# Patient Record
Sex: Male | Born: 2000 | Race: Black or African American | Hispanic: No | Marital: Single | Smoking: Current every day smoker
Health system: Southern US, Community
[De-identification: ages and names within clinical notes are randomized; demographics above are authoritative.]

## PROBLEM LIST (undated history)

## (undated) ENCOUNTER — Encounter

## (undated) ENCOUNTER — Ambulatory Visit

## (undated) ENCOUNTER — Encounter: Attending: Infectious Disease | Primary: Infectious Disease

## (undated) ENCOUNTER — Telehealth: Attending: Family | Primary: Family

## (undated) ENCOUNTER — Encounter: Attending: Family | Primary: Family

## (undated) ENCOUNTER — Telehealth

## (undated) ENCOUNTER — Ambulatory Visit: Payer: PRIVATE HEALTH INSURANCE | Attending: Family | Primary: Family

## (undated) ENCOUNTER — Encounter: Payer: MEDICARE | Attending: Family | Primary: Family

## (undated) ENCOUNTER — Ambulatory Visit: Attending: Family | Primary: Family

## (undated) ENCOUNTER — Ambulatory Visit: Attending: Pharmacist | Primary: Pharmacist

---

## 2020-02-29 DIAGNOSIS — B20 Human immunodeficiency virus [HIV] disease: Principal | ICD-10-CM

## 2020-03-05 ENCOUNTER — Encounter: Admit: 2020-03-05 | Discharge: 2020-03-06 | Payer: MEDICARE | Attending: Family | Primary: Family

## 2020-03-05 ENCOUNTER — Encounter: Admit: 2020-03-05 | Discharge: 2020-03-06 | Payer: MEDICARE

## 2020-03-05 DIAGNOSIS — Z717 Human immunodeficiency virus [HIV] counseling: Principal | ICD-10-CM

## 2020-03-05 DIAGNOSIS — Z21 Asymptomatic human immunodeficiency virus [HIV] infection status: Principal | ICD-10-CM

## 2020-03-05 DIAGNOSIS — B20 Human immunodeficiency virus [HIV] disease: Principal | ICD-10-CM

## 2020-03-05 DIAGNOSIS — A64 Unspecified sexually transmitted disease: Principal | ICD-10-CM

## 2020-03-05 DIAGNOSIS — Z7252 High risk homosexual behavior: Principal | ICD-10-CM

## 2020-03-05 MED ORDER — BICTEGRAVIR 50 MG-EMTRICITABINE 200 MG-TENOFOVIR ALAFENAM 25 MG TABLET: 1 | tablet | Freq: Every day | 0 refills | 7 days

## 2020-03-05 MED ORDER — BICTEGRAVIR 50 MG-EMTRICITABINE 200 MG-TENOFOVIR ALAFENAM 25 MG TABLET
ORAL_TABLET | Freq: Every day | ORAL | 11 refills | 30.00000 days | Status: CP
Start: 2020-03-05 — End: 2021-03-05

## 2020-04-07 ENCOUNTER — Encounter: Admit: 2020-04-07 | Discharge: 2020-04-08 | Payer: MEDICARE

## 2020-04-07 ENCOUNTER — Encounter: Admit: 2020-04-07 | Discharge: 2020-04-08 | Payer: MEDICARE | Attending: Family | Primary: Family

## 2020-04-07 DIAGNOSIS — A64 Unspecified sexually transmitted disease: Principal | ICD-10-CM

## 2020-04-07 DIAGNOSIS — Z7189 Other specified counseling: Principal | ICD-10-CM

## 2020-04-07 DIAGNOSIS — Z717 Human immunodeficiency virus [HIV] counseling: Principal | ICD-10-CM

## 2020-04-07 DIAGNOSIS — Z7252 High risk homosexual behavior: Principal | ICD-10-CM

## 2020-04-07 DIAGNOSIS — Z21 Asymptomatic human immunodeficiency virus [HIV] infection status: Principal | ICD-10-CM

## 2020-04-07 DIAGNOSIS — B20 Human immunodeficiency virus [HIV] disease: Principal | ICD-10-CM

## 2020-07-08 ENCOUNTER — Encounter: Admit: 2020-07-08 | Payer: MEDICARE | Attending: Family | Primary: Family

## 2020-07-25 ENCOUNTER — Ambulatory Visit: Admit: 2020-07-25 | Payer: MEDICARE | Attending: Family | Primary: Family

## 2020-10-16 ENCOUNTER — Ambulatory Visit: Admit: 2020-10-16 | Discharge: 2020-10-17

## 2020-10-16 ENCOUNTER — Encounter: Admit: 2020-10-16 | Discharge: 2020-10-17 | Payer: MEDICARE | Attending: Family | Primary: Family

## 2020-10-16 DIAGNOSIS — Z717 Human immunodeficiency virus [HIV] counseling: Principal | ICD-10-CM

## 2020-10-16 DIAGNOSIS — Z21 Asymptomatic human immunodeficiency virus [HIV] infection status: Principal | ICD-10-CM

## 2020-10-16 DIAGNOSIS — Z7252 High risk homosexual behavior: Principal | ICD-10-CM

## 2020-10-16 DIAGNOSIS — R5383 Other fatigue: Principal | ICD-10-CM

## 2020-10-16 DIAGNOSIS — B20 Human immunodeficiency virus [HIV] disease: Principal | ICD-10-CM

## 2020-10-16 DIAGNOSIS — R590 Localized enlarged lymph nodes: Principal | ICD-10-CM

## 2020-10-16 DIAGNOSIS — Z9114 Patient's other noncompliance with medication regimen: Principal | ICD-10-CM

## 2020-10-17 ENCOUNTER — Ambulatory Visit: Admit: 2020-10-17 | Discharge: 2020-10-18 | Attending: Family | Primary: Family

## 2020-10-17 DIAGNOSIS — B20 Human immunodeficiency virus [HIV] disease: Principal | ICD-10-CM

## 2020-10-17 DIAGNOSIS — Z21 Asymptomatic human immunodeficiency virus [HIV] infection status: Principal | ICD-10-CM

## 2020-10-17 DIAGNOSIS — Z717 Human immunodeficiency virus [HIV] counseling: Principal | ICD-10-CM

## 2020-10-17 MED ORDER — BICTEGRAVIR 50 MG-EMTRICITABINE 200 MG-TENOFOVIR ALAFENAM 25 MG TABLET
ORAL_TABLET | Freq: Every day | ORAL | 0 refills | 14.00000 days
Start: 2020-10-17 — End: 2020-11-10

## 2020-10-21 DIAGNOSIS — B20 Human immunodeficiency virus [HIV] disease: Principal | ICD-10-CM

## 2020-10-21 DIAGNOSIS — Z21 Asymptomatic human immunodeficiency virus [HIV] infection status: Principal | ICD-10-CM

## 2020-10-21 DIAGNOSIS — Z717 Human immunodeficiency virus [HIV] counseling: Principal | ICD-10-CM

## 2020-10-21 MED ORDER — BICTEGRAVIR 50 MG-EMTRICITABINE 200 MG-TENOFOVIR ALAFENAM 25 MG TABLET
ORAL_TABLET | Freq: Every day | ORAL | 11 refills | 30.00000 days | Status: CP
Start: 2020-10-21 — End: 2020-10-28

## 2020-10-22 DIAGNOSIS — Z717 Human immunodeficiency virus [HIV] counseling: Principal | ICD-10-CM

## 2020-10-22 DIAGNOSIS — B20 Human immunodeficiency virus [HIV] disease: Principal | ICD-10-CM

## 2020-10-22 DIAGNOSIS — Z21 Asymptomatic human immunodeficiency virus [HIV] infection status: Principal | ICD-10-CM

## 2020-10-28 DIAGNOSIS — B20 Human immunodeficiency virus [HIV] disease: Principal | ICD-10-CM

## 2020-10-28 DIAGNOSIS — Z21 Asymptomatic human immunodeficiency virus [HIV] infection status: Principal | ICD-10-CM

## 2020-10-28 DIAGNOSIS — Z717 Human immunodeficiency virus [HIV] counseling: Principal | ICD-10-CM

## 2020-10-28 MED ORDER — BICTEGRAVIR 50 MG-EMTRICITABINE 200 MG-TENOFOVIR ALAFENAM 25 MG TABLET
ORAL_TABLET | Freq: Every day | ORAL | 11 refills | 30 days | Status: CP
Start: 2020-10-28 — End: 2021-10-28
  Filled 2020-10-30: qty 30, 30d supply, fill #0

## 2020-10-30 MED FILL — BIKTARVY 50 MG-200 MG-25 MG TABLET: 30 days supply | Qty: 30 | Fill #0 | Status: AC

## 2020-10-30 NOTE — Unmapped (Signed)
[facility]   Patient Onboarding/Medication Counseling    Alec Hines is a 19 y.o. male with HIV who I am counseling today on continuation of therapy.  I am speaking to the patient.    Was a Nurse, learning disability used for this call? No    Verified patient's date of birth / HIPAA.    Specialty medication(s) to be sent: Infectious Disease: Biktarvy      Non-specialty medications/supplies to be sent: n/a      Medications not needed at this time: n/a         Biktarvy (bictegravir, emtricitabine, and tenofovir alafenamide)    The patient declined counseling on medication administration, missed dose instructions, goals of therapy, side effects and monitoring parameters, warnings and precautions, drug/food interactions and storage, handling precautions, and disposal because they have taken the medication previously. The information in the declined sections below are for informational purposes only and was not discussed with patient.       Medication & Administration     Dosage: Take 1 tablet by mouth daily    Administration: Take without regard to food    Adherence/Missed dose instructions: take missed dose as soon as you remember. If it is close to the time of your next dose, skip the dose and resume with your next scheduled dose.    Goals of Therapy     To suppress viral replication and keep patient's HIV undetectable by lab tests    Side Effects & Monitoring Parameters     Common Side Effects:  ??? Diarrhea  ??? Upset stomach  ??? Headache  ??? Changes in Weight  ??? Changes in mood    The following side effects should be reported to the provider:     ?? If patient experiences: signs of an allergic reaction (rash; hives; itching; red, swollen, blistered, or peeling skin with or without fever; wheezing; tightness in the chest or throat; trouble breathing, swallowing, or talking; unusual hoarseness; or swelling of the mouth, face, lips, tongue, or throat)  ?? signs of kidney problems (unable to pass urine, change in how much urine is passed, blood in the urine, or a big weight gain)  ?? signs of liver problems (dark urine, feeling tired, not hungry, upset stomach or stomach pain, light-colored stools, throwing up, or yellow skin or eyes)  ?? signs of lactic acidosis (fast breathing, fast heartbeat, a heartbeat that does not feel normal, very bad upset stomach or throwing up, feeling very sleepy, shortness of breath, feeling very tired or weak, very bad dizziness, feeling cold, or muscle pain or cramps)  Weight gain: some patients have reported weight gain after starting this medication. The amount of weight can vary.    Monitoring Parameters:  ?? CD4  Count  ?? HIV RNA plasma levels,  ?? Liver function  ?? Total bilirubin  ?? serum creatinine  ?? urine glucose  ?? urine protein (prior to or when initiating therapy and as clinically indicated during therapy);       Drug/Food Interactions     ??? Medication list reviewed in Epic. The patient was instructed to inform the care team before taking any new medications or supplements. No drug interactions identified.   ??? Calcium Salts: May decrease the serum concentration of Biktarvy. If taken with food, Biktarvy can be administered with calcium salts.   ??? Iron Preparations: May decrease the serum concentration of Biktarvy. If taken with food, Biktarvy can be administered with Ferrous sulfate. If taken on an empty  stomach, Biktarvy must be taken 2 hours before ferrous sulfate. Avoid other iron salts.    Contraindications, Warnings, & Precautions     ??? Black Box Warning: Severe acute exacertbations of HBV have been reported in patients coinfected with HIV-1 and HBV fllowing discontinuation of therapy  ??? Coadministration with dofetilide, rifampin is contraindicated  ??? Immune reconstitution syndrome: Patients may develop immune reconstitution syndrome, resulting in the occurrence of an inflammatory response to an indolent or residual opportunistic infection or activation of autoimmune disorders (eg, Graves disease, polymyositis, Guillain-Barr?? syndrome, autoimmune hepatitis)   ??? Lactic acidosis/hepatomegaly  ??? Renal toxicity: patients with preexisting renal impairment and those taking nephrotoxic agents (including NSAIDs) are at increased risk.     Storage, Handling Precautions, & Disposal     ??? Store in the original container at room temperature.   ??? Keep lid tightly closed.   ??? Store in a dry place. Do not store in a bathroom.   ??? Keep all drugs in a safe place. Keep all drugs out of the reach of children and pets.   ??? Throw away unused or expired drugs. Do not flush down a toilet or pour down a drain unless you are told to do so. Check with your pharmacist if you have questions about the best way to throw out drugs. There may be drug take-back programs in your area.        Current Medications (including OTC/herbals), Comorbidities and Allergies     Current Outpatient Medications   Medication Sig Dispense Refill   ??? bictegrav-emtricit-tenofov ala (BIKTARVY) 50-200-25 mg tablet Take 1 tablet by mouth daily for 14 days. 14 tablet 0   ??? bictegrav-emtricit-tenofov ala (BIKTARVY) 50-200-25 mg tablet Take 1 tablet by mouth daily. 30 tablet 11     No current facility-administered medications for this visit.       No Known Allergies    There is no problem list on file for this patient.      Reviewed and up to date in Epic.    Appropriateness of Therapy     Is medication and dose appropriate based on diagnosis? Yes    Prescription has been clinically reviewed: Yes    Baseline Quality of Life Assessment      How many days over the past month did your HIV  keep you from your normal activities? For example, brushing your teeth or getting up in the morning. 0    Financial Information     Medication Assistance provided: None Required    Anticipated copay of $0.00 reviewed with patient. Verified delivery address.    Delivery Information     Scheduled delivery date: 10/30/20    Expected start date: 10/30/20    Medication will be delivered via Same Day Courier to the prescription address in [facility].  This shipment will not require a signature.      Explained the services we provide at [facility] and that each month we would call to set up refills.  Stressed importance of returning phone calls so that we could ensure they receive their medications in time each month.  Informed patient that we should be setting up refills 7-10 days prior to when they will run out of medication.  A pharmacist will reach out to perform a clinical assessment periodically.  Informed patient that a welcome packet and a drug information handout will be sent.      Patient verbalized understanding of the above information as well as  how to contact the pharmacy at [phone] option 4 with any questions/concerns.  The pharmacy is open Monday through Friday 8:30am-4:30pm.  A pharmacist is available 24/7 via pager to answer any clinical questions they may have.    Patient Specific Needs     - Does the patient have any physical, cognitive, or cultural barriers? No    - Patient prefers to have medications discussed with  Patient     - Is the patient or caregiver able to read and understand education materials at a high school level or above? Yes    - Patient's primary language is  English     - Is the patient high risk? No    - Does the patient require a Care Management Plan? No     - Does the patient require physician intervention or other additional services (i.e. nutrition, smoking cessation, social work)? No      [name]  [facility] Specialty Pharmacist

## 2020-10-30 NOTE — Unmapped (Signed)
[facility] Specialty Medication Onboarding    Specialty Medication: BIKTARVY  Prior Authorization: Not Required   Financial Assistance: No - copay  <$25  Final Copay/Day Supply: $0 / 30    Insurance Restrictions: Yes - max 1 month supply     Notes to Pharmacist: Pt has Temp PAP benefits    The triage team has completed the benefits investigation and has determined that the patient is able to fill this medication at [facility]. Please contact the patient to complete the onboarding or follow up with the prescribing physician as needed.

## 2020-11-07 ENCOUNTER — Ambulatory Visit: Admit: 2020-11-07 | Discharge: 2020-11-08 | Attending: Family | Primary: Family

## 2020-11-07 DIAGNOSIS — Z7252 High risk homosexual behavior: Principal | ICD-10-CM

## 2020-11-07 DIAGNOSIS — Z717 Human immunodeficiency virus [HIV] counseling: Principal | ICD-10-CM

## 2020-11-07 DIAGNOSIS — L709 Acne, unspecified: Principal | ICD-10-CM

## 2020-11-07 DIAGNOSIS — B20 Human immunodeficiency virus [HIV] disease: Principal | ICD-10-CM

## 2020-11-07 DIAGNOSIS — L739 Follicular disorder, unspecified: Principal | ICD-10-CM

## 2020-11-07 DIAGNOSIS — R21 Rash and other nonspecific skin eruption: Principal | ICD-10-CM

## 2020-11-07 DIAGNOSIS — Z21 Asymptomatic human immunodeficiency virus [HIV] infection status: Principal | ICD-10-CM

## 2020-11-07 MED ORDER — CHLORHEXIDINE GLUCONATE 4 % TOPICAL LIQUID
TOPICAL | 0 refills | 0.00000 days | Status: CP
Start: 2020-11-07 — End: ?
  Filled 2020-11-11: qty 118, 30d supply, fill #0

## 2020-11-07 MED ORDER — MUPIROCIN 2 % TOPICAL OINTMENT
Freq: Three times a day (TID) | TOPICAL | 0 refills | 7.00000 days | Status: CP
Start: 2020-11-07 — End: 2020-12-11
  Filled 2020-11-11: qty 22, 7d supply, fill #0

## 2020-11-07 MED ORDER — CLINDAMYCIN 1 % LOTION
Freq: Two times a day (BID) | TOPICAL | 0 refills | 0 days | Status: CP
Start: 2020-11-07 — End: 2021-11-07
  Filled 2020-11-13: qty 60, 15d supply, fill #0

## 2020-11-10 NOTE — Unmapped (Signed)
Assessment/Plan:      Diagnosis ICD-10-CM Associated Orders   1. Human immunodeficiency virus (HIV) disease (CMS-HCC)  B20 clindamycin (CLEOCIN T) 1 % lotion     chlorhexidine (HIBICLENS) 4 % external liquid     mupirocin (BACTROBAN) 2 % ointment   2. HIV antibody positive (CMS-HCC)  Z21 clindamycin (CLEOCIN T) 1 % lotion     chlorhexidine (HIBICLENS) 4 % external liquid     mupirocin (BACTROBAN) 2 % ointment   3. Encounter for HIV counseling  Z71.7 clindamycin (CLEOCIN T) 1 % lotion     chlorhexidine (HIBICLENS) 4 % external liquid     mupirocin (BACTROBAN) 2 % ointment   4. High risk homosexual behavior  Z72.52 clindamycin (CLEOCIN T) 1 % lotion     chlorhexidine (HIBICLENS) 4 % external liquid     mupirocin (BACTROBAN) 2 % ointment   5. Rash and nonspecific skin eruption  R21 clindamycin (CLEOCIN T) 1 % lotion     chlorhexidine (HIBICLENS) 4 % external liquid     mupirocin (BACTROBAN) 2 % ointment   6. Acne, unspecified acne type  L70.9 clindamycin (CLEOCIN T) 1 % lotion     chlorhexidine (HIBICLENS) 4 % external liquid     mupirocin (BACTROBAN) 2 % ointment   7. Folliculitis  L73.9 clindamycin (CLEOCIN T) 1 % lotion     chlorhexidine (HIBICLENS) 4 % external liquid     mupirocin (BACTROBAN) 2 % ointment       Return in about 1 month (around 12/08/2020).    HPI:     Alec Hines is a 20 y.o. year old Black or [place] American male who presents today for HIV follow-up.  Patient recently tested positive for HIV on 02/22/20. ??He was taking Truvada PrEP and went for follow up testing and HIV antibody returned positive. ??Patient has notified sexual contacts. ??Has been reported by [facility]. Patient was infected through homosexual contact about 9 months ago. Discussed HIV biology, medication compliance, resistance, side effects, safe sex with condoms, follow up appointments. Recently tested positive for chlamydia, treated. ??Patient chose to start HAART Biktarvy. ??HIV book, condoms, 7 day Biktarvy samples at last visit.  Last HIV RNA 32,305 CD4% 25 Absolute CD4 600. ??Has not been taking medication, there was an issue with his insurance.  He does have cervical lymphadenopathy, rash on both arms and trunk, follicullitis and fatigue secondary to HIV.  Will start him on topical remedies for acne/rash clobetisol for facial acne, hibiclens and bactroban for groin buttock abscesses and arm/torso rash.  Will collect labs today.  Follow up in??1??month.??      Past Medical/Surgical History:     Past Medical History:   Diagnosis Date   ??? Chlamydia    ??? HIV disease (CMS-HCC)      No past surgical history on file.    Social History:     Social History     Socioeconomic History   ??? Marital status: None     Spouse name: None   ??? Number of children: None   ??? Years of education: None   ??? Highest education level: None   Occupational History   ??? None   Tobacco Use   ??? Smoking status: Current Every Day Smoker     Types: e-Cigarettes   ??? Smokeless tobacco: Never Used   Vaping Use   ??? Vaping Use: Every day   ??? Substances: Nicotine, Flavoring   Substance and Sexual Activity   ??? Alcohol use: Yes  Comment: every now and then   ??? Drug use: Yes     Types: Marijuana   ??? Sexual activity: None   Other Topics Concern   ??? None   Social History Narrative   ??? None     Social Determinants of Health     Financial Resource Strain: Not on file   Food Insecurity: Not on file   Transportation Needs: Not on file   Physical Activity: Not on file   Stress: Not on file   Social Connections: Not on file       Family History:     Family History   Problem Relation Age of Onset   ??? No Known Problems Mother    ??? No Known Problems Father    ??? No Known Problems Brother        Allergies:     Patient has no known allergies.    Current Medications:     Current Outpatient Medications   Medication Sig Dispense Refill   ??? bictegrav-emtricit-tenofov ala (BIKTARVY) 50-200-25 mg tablet Take 1 tablet by mouth daily. 30 tablet 11   ??? chlorhexidine (HIBICLENS) 4 % external liquid Apply topically once a week. To affected areas.  Allow to dry 1 hour then shower off 120 mL 0   ??? clindamycin (CLEOCIN T) 1 % lotion Apply topically Two (2) times a day. 60 mL 0   ??? mupirocin (BACTROBAN) 2 % ointment Apply topically Three (3) times a day for 7 days. 30 g 0     No current facility-administered medications for this visit.       ROS:     Constitutional: Negative for fever, chills, appetite change, fatigue and unexpected weight change.   HEENT:  Head: Negative for headache, head injury, dizziness, lightheadedness.  Eyes: Glasses or contact lenses.  Negative for pain, redness, excessive tearing, double or blurred vision, spots, specks, flashing lights, glaucoma, cataracts, photophobia.  Ears:  Negative for hearing change, tinnitus, vertigo, earache, infection, discharge.  Nose and sinuses: Negative for frequent colds, nasal stuffiness, discharge or itching, hay fever, nosebleeds.  Throat: Negative for dental abscess, bleeding gums, dentures, sore tongue, dry mouth, frequent sore throats, hoarseness.    Neck:  Negative for lumps, swollen glands, goiter, pain, stiffness  Respiratory: Negative for cough, sputum, hemoptysis, dyspnea, wheezing, pleurisy, last chest x-ray.  Negative for asthma, bronchitis, emphysema, pneumonia, tuberculosis.??   Cardiovascular: Negative for chest pain, palpitations, hypertension, rheumatic fever, heart murmurs, dyspnea, orthopnea, paroxysmal nocturnal dyspnea, edema, and leg swelling.   Gastrointestinal: Negative for dysphagia, nausea, vomiting, abdominal pain, diarrhea, constipation, blood in stool, abdominal distention, heartburn, appetite change, food intolerance, and anal bleeding.   Endocrine: Negative for cold intolerance, heat intolerance, polydipsia, polyphagia and polyuria.   Genitourinary: Negative for dysuria, urgency, frequency, flank pain, difficulty urinating, hematuria, hesitancy, dribbling, difficulty starting/stopping stream, and genital sores. Musculoskeletal: Negative for myalgias, back pain, joint swelling, arthralgias, gait problem and neck stiffness. ??  Skin: Negative for color change, pallor, rash, itching, and wound.   Allergic/Immunologic: Negative for environmental allergies and food allergies.   Neurological: Negative for dizziness, tremors, seizures, syncope, speech difficulty, weakness, light-headedness, numbness and headaches.  Negative for changes in mood, attention, orientation, or speech.   Hematological: Negative for adenopathy. Does not bruise/bleed easily.  Negative transfusion reactions.   Psychiatric/Behavioral: Negative for suicidal ideas, hallucinations, behavioral problems, confusion, sleep disturbance, self-injury, dyphoric mood and agitation. The patient is not nervous/anxious.??     Vital Signs:     Vitals:  11/07/20 1139   BP: 101/67   Pulse: 80   Resp: 16   Temp: 36.7 ??C (98 ??F)   SpO2: 98%     BP Readings from Last 3 Encounters:   11/07/20 101/67   10/16/20 101/64   04/07/20 111/73     Wt Readings from Last 3 Encounters:   11/07/20 67.1 kg (148 lb) (38 %, Z= -0.31)*   10/16/20 69.4 kg (153 lb) (47 %, Z= -0.09)*   04/07/20 70.3 kg (155 lb) (53 %, Z= 0.06)*     * Growth percentiles are based on CDC (Boys, 2-20 Years) data.       Immunizations:       There is no immunization history on file for this patient.    Labs:     Platelet   Date Value Ref Range Status   10/16/2020 313 150 - 450 10*9/L Final     HGB   Date Value Ref Range Status   10/16/2020 12.5 (L) 13.5 - 17.5 g/dL Final     WBC   Date Value Ref Range Status   10/16/2020 5.3 3.5 - 10.5 10*9/L Final     BUN   Date Value Ref Range Status   10/16/2020 9 9 - 23 mg/dL Final     Creatinine   Date Value Ref Range Status   10/16/2020 0.86 0.70 - 1.30 mg/dL Final     ALT   Date Value Ref Range Status   10/16/2020 29 10 - 49 U/L Final     AST   Date Value Ref Range Status   10/16/2020 29 <34 U/L Final     Potassium   Date Value Ref Range Status   10/16/2020 4.0 3.5 - 5.1 mmol/L Final     CO2   Date Value Ref Range Status   10/16/2020 27.0 20.0 - 31.0 mmol/L Final     HIV RNA   Date Value Ref Range Status   10/16/2020 32,305 (H) <0 copies/mL Final     Hepatitis C Ab   Date Value Ref Range Status   03/05/2020 Nonreactive Nonreactive Final     Gonorrhoeae NAA   Date Value Ref Range Status   03/05/2020 Negative Negative Final     Chlamydia trachomatis, NAA   Date Value Ref Range Status   03/05/2020 Negative Negative Final     CD4% (T Helper)   Date Value Ref Range Status   10/16/2020 25 (L) 34 - 58 % Final         Physical Exam:     General: Well-appearing, calm, well-groomed, well-nourished.  No distress  Eyes: PERRL. Extraocular muscles intact, sclera clear   ENT: Nares without discharge, oropharynx without lesions or exudate.   Neck: Supple, no thyromegaly, bruit, or JVD.   Lymph Nodes: No adenopathy (cervical, axillary)   Cardiovascular: RRR, S1 and S2 normal. No murmur, rub, or gallop.   Lungs: Clear to auscultation bilaterally without wheezes/crackles/rhonchi. Good air movement.   Skin: No rashes or lesions noted on limited exam. Warm and dry with no erythema or pallor.  Abdomen: Normoactive bowel sounds, abdomen soft, nontender, and nondistended, no hepatosplenomegaly or masses. Liver normal in size, no rebound or guarding.   Genitourinary: Deferred   Extremities: No cyanosis, clubbing or edema. No joint effusions, FROM without tenderness.   Musculoskeletal: No spine or costovertebral angle tenderness.   Neuro: Alert and oriented to person, place, and time. Cranial nerves II-XII grossly intact, normal gait, normal sensation throughout, normal cerebellar function.  Psychiatry: Alert and oriented to person, place, and time, conversant, appropriate affect.   Nursing notes and vitals reviewed

## 2020-11-17 ENCOUNTER — Emergency Department (HOSPITAL_COMMUNITY)
Admission: EM | Admit: 2020-11-17 | Discharge: 2020-11-17 | Disposition: A | Discharge status: Home / Self Care | Payer: Self-pay | Attending: Emergency Medicine | Admitting: Emergency Medicine

## 2020-11-17 ENCOUNTER — Encounter (HOSPITAL_COMMUNITY): Payer: Self-pay | Admitting: Emergency Medicine

## 2020-11-17 ENCOUNTER — Other Ambulatory Visit: Payer: Self-pay

## 2020-11-17 DIAGNOSIS — Z5321 Procedure and treatment not carried out due to patient leaving prior to being seen by health care provider: Secondary | ICD-10-CM | POA: Insufficient documentation

## 2020-11-17 DIAGNOSIS — H579 Unspecified disorder of eye and adnexa: Secondary | ICD-10-CM | POA: Insufficient documentation

## 2020-11-17 DIAGNOSIS — H02849 Edema of unspecified eye, unspecified eyelid: Secondary | ICD-10-CM | POA: Insufficient documentation

## 2020-11-17 DIAGNOSIS — H538 Other visual disturbances: Secondary | ICD-10-CM | POA: Insufficient documentation

## 2020-11-17 DIAGNOSIS — H5789 Other specified disorders of eye and adnexa: Secondary | ICD-10-CM | POA: Insufficient documentation

## 2020-11-17 NOTE — ED Triage Notes (Signed)
Per pt, states he was diagnosed with pink eye this past Friday-states he was given antibiotic cream-states he is still having redness and pain, not getting better

## 2020-11-17 NOTE — ED Triage Notes (Signed)
Patient developed left eye redness and blurry vision [facility] given eye ointment antibiotic. Here for evaluation because having same symptoms.

## 2020-11-18 MED ORDER — TOBRAMYCIN 0.3 %-DEXAMETHASONE 0.1 % EYE DROPS,SUSPENSION
0 days
Start: 2020-11-18 — End: ?

## 2020-11-20 NOTE — Unmapped (Addendum)
[facility] Refill Coordination Note    Specialty Medication(s) to be Shipped:   Infectious Disease: Biktarvy    Other medication(s) to be shipped: No additional medications requested for fill at this time     Alec Hines, DOB: December 07, 2000  Phone: [phone] (home)       All above HIPAA information was verified with patient.     Was a Nurse, learning disability used for this call? No    Completed refill call assessment today to schedule patient's medication shipment from the [facility] [phone]).       Specialty medication(s) and dose(s) confirmed: Regimen is correct and unchanged.   Changes to medications: Alec Hines reports no changes at this time.  Changes to insurance: No  Questions for the pharmacist: No    Confirmed patient received Welcome Packet with first shipment. The patient will receive a drug information handout for each medication shipped and additional FDA Medication Guides as required.       DISEASE/MEDICATION-SPECIFIC INFORMATION        N/A    SPECIALTY MEDICATION ADHERENCE     Medication Adherence    Patient reported X missed doses in the last month: 0  Specialty Medication: biktarvy 50-2000-25mg   Patient is on additional specialty medications: No  Patient is on more than two specialty medications: No  Any gaps in refill history greater than 2 weeks in the last 3 months: no  Demonstrates understanding of importance of adherence: yes  Informant: patient  Reliability of informant: reliable  Provider-estimated medication adherence level: good  Patient is at risk for Non-Adherence: No                biktarvy 50-200-25 mg: 10 days of medicine on hand         SHIPPING     Shipping address confirmed in Epic.     Delivery Scheduled: Yes, Expected medication delivery date: 01/25.     Medication will be delivered via UPS to the temporary address in Epic WAM.    Alec Hines   [facility] Specialty Technician

## 2020-11-24 NOTE — Unmapped (Signed)
Alec Hines 's biktarvy shipment will be delayed due to pt already received a 30 days supply on Biktarvy last month,he is going to go tom to turn in application  We have contacted the patient and communicated the delivery change to patient/caregiver We will wait for a call back from the patient/caregiver to reschedule the delivery.  We have confirmed the delivery date as n/a .

## 2020-11-24 NOTE — Unmapped (Signed)
Spoke to patient's mother about HMAP application.

## 2020-11-24 NOTE — Unmapped (Signed)
Patients mom calls in regards to patient. Patients mother states patient has a lot going on and does not know exactly what's going on with patient and care. Patient mom has questions in regards to care and asks for a return call. Please advise

## 2020-11-25 NOTE — Unmapped (Signed)
Pt called today to ask about getting samples of his HIV medication. He has not yet returned his required paperwork to [facility] for Patient Assistance.   I have advised the pt that we do NOT have a provider in our office today due to an emergency - therefore we are unable to provide samples today. He reports he will try back on the 27th. He was also provided with the contact # for [facility] for [facility]. I have advised Alec Hines he may be able to fax the paperwork for faster service.   He voiced understanding and agreed with plan.

## 2020-11-26 NOTE — Unmapped (Signed)
Alec Hines 's Biktarvy shipment will be delayed due to pt had to get approved for PAP full benefit We have contacted the patient and communicated the delivery change to patient/caregiver We will reschedule the medication for the delivery date that the patient agreed upon. We have confirmed the delivery date as 01/28 .

## 2020-11-27 MED ORDER — PREDNISONE 10 MG TABLET
0 days
Start: 2020-11-27 — End: ?

## 2020-11-27 MED FILL — BIKTARVY 50 MG-200 MG-25 MG TABLET: ORAL | 30 days supply | Qty: 30 | Fill #1

## 2020-12-11 ENCOUNTER — Ambulatory Visit: Admit: 2020-12-11 | Discharge: 2020-12-12 | Payer: MEDICARE | Attending: Family | Primary: Family

## 2020-12-11 ENCOUNTER — Ambulatory Visit: Admit: 2020-12-11 | Discharge: 2020-12-12 | Payer: MEDICARE

## 2020-12-11 DIAGNOSIS — H539 Unspecified visual disturbance: Principal | ICD-10-CM

## 2020-12-11 DIAGNOSIS — Z21 Asymptomatic human immunodeficiency virus [HIV] infection status: Principal | ICD-10-CM

## 2020-12-11 DIAGNOSIS — R21 Rash and other nonspecific skin eruption: Principal | ICD-10-CM

## 2020-12-11 DIAGNOSIS — Z717 Human immunodeficiency virus [HIV] counseling: Principal | ICD-10-CM

## 2020-12-11 DIAGNOSIS — H5789 Other specified disorders of eye and adnexa: Principal | ICD-10-CM

## 2020-12-11 DIAGNOSIS — Z7252 High risk homosexual behavior: Principal | ICD-10-CM

## 2020-12-11 DIAGNOSIS — B20 Human immunodeficiency virus [HIV] disease: Principal | ICD-10-CM

## 2020-12-11 DIAGNOSIS — L709 Acne, unspecified: Principal | ICD-10-CM

## 2020-12-11 LAB — CBC W/ AUTO DIFF
BASOPHILS ABSOLUTE COUNT: 0 10*9/L (ref 0.0–0.1)
BASOPHILS RELATIVE PERCENT: 0.4 %
EOSINOPHILS ABSOLUTE COUNT: 0.1 10*9/L (ref 0.0–0.7)
EOSINOPHILS RELATIVE PERCENT: 0.7 %
HEMATOCRIT: 35.3 % — ABNORMAL LOW (ref 38.0–50.0)
HEMOGLOBIN: 11.8 g/dL — ABNORMAL LOW (ref 13.5–17.5)
LYMPHOCYTES ABSOLUTE COUNT: 2.1 10*9/L (ref 0.7–4.0)
LYMPHOCYTES RELATIVE PERCENT: 23.7 %
MEAN CORPUSCULAR HEMOGLOBIN CONC: 33.5 g/dL (ref 30.0–36.0)
MEAN CORPUSCULAR HEMOGLOBIN: 28.2 pg (ref 26.0–34.0)
MEAN CORPUSCULAR VOLUME: 84.2 fL (ref 81.0–95.0)
MEAN PLATELET VOLUME: 7.5 fL (ref 7.0–10.0)
MONOCYTES ABSOLUTE COUNT: 0.7 10*9/L (ref 0.1–1.0)
MONOCYTES RELATIVE PERCENT: 8 %
NEUTROPHILS ABSOLUTE COUNT: 5.9 10*9/L (ref 1.7–7.7)
NEUTROPHILS RELATIVE PERCENT: 67.2 %
PLATELET COUNT: 300 10*9/L (ref 150–450)
RED BLOOD CELL COUNT: 4.19 10*12/L — ABNORMAL LOW (ref 4.32–5.72)
RED CELL DISTRIBUTION WIDTH: 16.5 % — ABNORMAL HIGH (ref 12.0–15.0)
WBC ADJUSTED: 8.8 10*9/L (ref 3.5–10.5)

## 2020-12-11 LAB — COMPREHENSIVE METABOLIC PANEL
ALBUMIN: 3.2 g/dL — ABNORMAL LOW (ref 3.4–5.0)
ALKALINE PHOSPHATASE: 64 U/L (ref 46–116)
ALT (SGPT): 47 U/L (ref 10–49)
ANION GAP: 7 mmol/L (ref 3–11)
AST (SGOT): 28 U/L (ref <34)
BILIRUBIN TOTAL: 0.3 mg/dL (ref 0.3–1.2)
BLOOD UREA NITROGEN: 12 mg/dL (ref 9–23)
BUN / CREAT RATIO: 14
CALCIUM: 9.2 mg/dL (ref 8.7–10.4)
CHLORIDE: 106 mmol/L (ref 98–107)
CO2: 25 mmol/L (ref 20.0–31.0)
CREATININE: 0.84 mg/dL (ref 0.70–1.30)
EGFR CKD-EPI AA MALE: >90 mL/min/{1.73_m2}
EGFR CKD-EPI NON-AA MALE: >90 mL/min/{1.73_m2}
GLUCOSE RANDOM: 149 mg/dL (ref 70–179)
POTASSIUM: 3.5 mmol/L (ref 3.5–5.1)
PROTEIN TOTAL: 7.3 g/dL (ref 5.7–8.2)
SODIUM: 138 mmol/L (ref 136–145)

## 2020-12-11 NOTE — Unmapped (Signed)
Spoke to patient about getting additional labs Quantiferon and AFB blood cultures drawn.  He will go to [facility] lab in [place] later today.

## 2020-12-11 NOTE — Unmapped (Signed)
Assessment/Plan:      Diagnosis ICD-10-CM Associated Orders   1. Human immunodeficiency virus (HIV) disease (CMS-HCC)  B20 CBC w/ Differential     Metabolic panel, Comp (Chem10 + LFTs)     HIV RNA, Quantitative, PCR     Lymphocyte Markers Limited     CMV IgG     CMV DNA, quantitative, PCR     HSV PCR     Varicella zoster antibody, IgG     Syphilis Screen     Toxoplasma gondii Antibody, IgG     Quantiferon TB Gold Plus     Blood Culture, AFB Peripheral   2. HIV antibody positive (CMS-HCC)  Z21 CBC w/ Differential     Metabolic panel, Comp (Chem10 + LFTs)     HIV RNA, Quantitative, PCR     Lymphocyte Markers Limited     CMV IgG     CMV DNA, quantitative, PCR     HSV PCR     Varicella zoster antibody, IgG     Syphilis Screen     Toxoplasma gondii Antibody, IgG     Quantiferon TB Gold Plus     Blood Culture, AFB Peripheral   3. Encounter for HIV counseling  Z71.7 CBC w/ Differential     Metabolic panel, Comp (Chem10 + LFTs)     HIV RNA, Quantitative, PCR     Lymphocyte Markers Limited     CMV IgG     CMV DNA, quantitative, PCR     HSV PCR     Varicella zoster antibody, IgG     Syphilis Screen     Toxoplasma gondii Antibody, IgG     Quantiferon TB Gold Plus     Blood Culture, AFB Peripheral   4. High risk homosexual behavior  Z72.52 CBC w/ Differential     Metabolic panel, Comp (Chem10 + LFTs)     HIV RNA, Quantitative, PCR     Lymphocyte Markers Limited     CMV IgG     CMV DNA, quantitative, PCR     HSV PCR     Varicella zoster antibody, IgG     Syphilis Screen     Toxoplasma gondii Antibody, IgG     Quantiferon TB Gold Plus     Blood Culture, AFB Peripheral   5. Rash and nonspecific skin eruption  R21 CBC w/ Differential     Metabolic panel, Comp (Chem10 + LFTs)     HIV RNA, Quantitative, PCR     Lymphocyte Markers Limited     CMV IgG     CMV DNA, quantitative, PCR     HSV PCR     Varicella zoster antibody, IgG     Syphilis Screen     Toxoplasma gondii Antibody, IgG     Quantiferon TB Gold Plus     Blood Culture, AFB Peripheral   6. Acne, unspecified acne type  L70.9 CBC w/ Differential     Metabolic panel, Comp (Chem10 + LFTs)     HIV RNA, Quantitative, PCR     Lymphocyte Markers Limited     CMV IgG     CMV DNA, quantitative, PCR     HSV PCR     Varicella zoster antibody, IgG     Syphilis Screen     Toxoplasma gondii Antibody, IgG     Quantiferon TB Gold Plus     Blood Culture, AFB Peripheral   7. Eye inflammation  H57.89 CBC w/ Differential     Metabolic panel, Comp (  Chem10 + LFTs)     HIV RNA, Quantitative, PCR     Lymphocyte Markers Limited     CMV IgG     CMV DNA, quantitative, PCR     HSV PCR     Varicella zoster antibody, IgG     Syphilis Screen     Toxoplasma gondii Antibody, IgG     Quantiferon TB Gold Plus     Blood Culture, AFB Peripheral   8. Vision changes  H53.9 Toxoplasma gondii Antibody, IgG     Quantiferon TB Gold Plus     Blood Culture, AFB Peripheral       Return in about 3 months (around 20/08/2021).    HPI:     Alec Hines is a 20 y.o. year old Black or [place] American male who presents today for HIV follow-up.  Patient recently tested positive for HIV on 02/22/20. ??He was taking Truvada PrEP and went for follow up testing and HIV antibody returned positive. ??Patient has notified sexual contacts. ??Has been reported by [facility] [facility]. Patient was infected through homosexual contact about??9??months ago. Discussed HIV biology, medication compliance, resistance, side effects, safe sex with condoms, follow up appointments. Recently tested positive for chlamydia, treated. ??Patient chose to start HAART Biktarvy. ??HIV book, condoms, 7 day Biktarvy samples at last visit.  Last HIV RNA??32,305??CD4% 25??Absolute CD4 600.????Has been taking medication with no missed doses.  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 ??He does have rash on both arms and trunk that is improving.  Has some darkened skin on his nose.  He will see Dermatology for evaluation.  Started him on topical remedies for acne/rash clobetisol for facial acne, improving.  He has seen Optmetrist for left eye pain and inflammation.  He is using drops daily.  Will check additional labs for CMV, toxoplasmosis, quantiferon, AFB blood culture, HSV.  Will collect HIV labs today. ??Follow up in??3??months.??        Past Medical/Surgical History:     Past Medical History:   Diagnosis Date   ??? Chlamydia    ??? HIV disease (CMS-HCC)      No past surgical history on file.    Social History:     Social History     Socioeconomic History   ??? Marital status: None     Spouse name: None   ??? Number of children: None   ??? Years of education: None   ??? Highest education level: None   Occupational History   ??? None   Tobacco Use   ??? Smoking status: Former Smoker     Types: e-Cigarettes   ??? Smokeless tobacco: Never Used   Vaping Use   ??? Vaping Use: Former   ??? Quit date: 12/04/2020   ??? Substances: Nicotine, Flavoring   Substance and Sexual Activity   ??? Alcohol use: Yes     Comment: every now and then   ??? Drug use: Yes     Types: Marijuana   ??? Sexual activity: None   Other Topics Concern   ??? None   Social History Narrative   ??? None     Social Determinants of Health     Financial Resource Strain: Not on file   Food Insecurity: Not on file   Transportation Needs: Not on file   Physical Activity: Not on file   Stress: Not on file   Social Connections: Not on file       Family History:     Family History  Problem Relation Age of Onset   ??? No Known Problems Mother    ??? No Known Problems Father    ??? No Known Problems Brother        Allergies:     Patient has no known allergies.    Current Medications:     Current Outpatient Medications   Medication Sig Dispense Refill   ??? bictegrav-emtricit-tenofov ala (BIKTARVY) 50-200-25 mg tablet Take 1 tablet by mouth daily. 30 tablet 11   ??? chlorhexidine (HIBICLENS) 4 % external liquid Apply to affected areas once a week.  Allow to dry 1 hour then shower off. 118 mL 0   ??? clindamycin (CLEOCIN T) 1 % lotion Apply topically Two (2) times a day. 60 mL 0   ??? predniSONE (DELTASONE) 10 MG tablet TAKE 6 TABLETS BY MOUTH DAILY.     ??? tobramycin-dexamethasone (TOBRADEX) 0.3-0.1 % ophthalmic suspension        No current facility-administered medications for this visit.       ROS:     Constitutional: Negative for fever, chills, appetite change, fatigue and unexpected weight change.   HEENT:  Head: Negative for headache, head injury, dizziness, lightheadedness.  Eyes: Glasses or contact lenses.  Negative for pain, redness, excessive tearing, double or blurred vision, spots, specks, flashing lights, glaucoma, cataracts, photophobia.  Ears:  Negative for hearing change, tinnitus, vertigo, earache, infection, discharge.  Nose and sinuses: Negative for frequent colds, nasal stuffiness, discharge or itching, hay fever, nosebleeds.  Throat: Negative for dental abscess, bleeding gums, dentures, sore tongue, dry mouth, frequent sore throats, hoarseness.    Neck:  Negative for lumps, swollen glands, goiter, pain, stiffness  Respiratory: Negative for cough, sputum, hemoptysis, dyspnea, wheezing, pleurisy, last chest x-ray.  Negative for asthma, bronchitis, emphysema, pneumonia, tuberculosis.??   Cardiovascular: Negative for chest pain, palpitations, hypertension, rheumatic fever, heart murmurs, dyspnea, orthopnea, paroxysmal nocturnal dyspnea, edema, and leg swelling.   Gastrointestinal: Negative for dysphagia, nausea, vomiting, abdominal pain, diarrhea, constipation, blood in stool, abdominal distention, heartburn, appetite change, food intolerance, and anal bleeding.   Endocrine: Negative for cold intolerance, heat intolerance, polydipsia, polyphagia and polyuria.   Genitourinary: Negative for dysuria, urgency, frequency, flank pain, difficulty urinating, hematuria, hesitancy, dribbling, difficulty starting/stopping stream, and genital sores.   Musculoskeletal: Negative for myalgias, back pain, joint swelling, arthralgias, gait problem and neck stiffness. ??  Skin: Negative for color change, pallor, rash, itching, and wound.   Allergic/Immunologic: Negative for environmental allergies and food allergies.   Neurological: Negative for dizziness, tremors, seizures, syncope, speech difficulty, weakness, light-headedness, numbness and headaches.  Negative for changes in mood, attention, orientation, or speech.   Hematological: Negative for adenopathy. Does not bruise/bleed easily.  Negative transfusion reactions.   Psychiatric/Behavioral: Negative for suicidal ideas, hallucinations, behavioral problems, confusion, sleep disturbance, self-injury, dyphoric mood and agitation. The patient is not nervous/anxious.??     Vital Signs:     Vitals:    12/11/20 0908   BP: 102/65   Pulse: 109   Resp: 16   Temp: 36.7 ??C (98 ??F)   SpO2: 97%     BP Readings from Last 3 Encounters:   12/11/20 102/65   11/07/20 101/67   10/16/20 101/64     Wt Readings from Last 3 Encounters:   12/11/20 70.8 kg (156 lb) (51 %, Z= 0.02)*   11/07/20 67.1 kg (148 lb) (38 %, Z= -0.31)*   10/16/20 69.4 kg (153 lb) (47 %, Z= -0.09)*     * Growth percentiles are based  on CDC (Boys, 2-20 Years) data.       Immunizations:       There is no immunization history on file for this patient.    Labs:     Platelet   Date Value Ref Range Status   10/16/2020 313 150 - 450 10*9/L Final     HGB   Date Value Ref Range Status   10/16/2020 12.5 (L) 13.5 - 17.5 g/dL Final     WBC   Date Value Ref Range Status   10/16/2020 5.3 3.5 - 10.5 10*9/L Final     BUN   Date Value Ref Range Status   10/16/2020 9 9 - 23 mg/dL Final     Creatinine   Date Value Ref Range Status   10/16/2020 0.86 0.70 - 1.30 mg/dL Final     ALT   Date Value Ref Range Status   10/16/2020 29 10 - 49 U/L Final     AST   Date Value Ref Range Status   10/16/2020 29 <34 U/L Final     Potassium   Date Value Ref Range Status   10/16/2020 4.0 3.5 - 5.1 mmol/L Final     CO2   Date Value Ref Range Status   10/16/2020 27.0 20.0 - 31.0 mmol/L Final     HIV RNA   Date Value Ref Range Status   10/16/2020 32,305 (H) <0 copies/mL Final     Hepatitis C Ab   Date Value Ref Range Status   03/05/2020 Nonreactive Nonreactive Final     Gonorrhoeae NAA   Date Value Ref Range Status   03/05/2020 Negative Negative Final     Chlamydia trachomatis, NAA   Date Value Ref Range Status   03/05/2020 Negative Negative Final     CD4% (T Helper)   Date Value Ref Range Status   10/16/2020 25 (L) 34 - 58 % Final         Physical Exam:     General: Well-appearing, calm, well-groomed, well-nourished.  No distress  Eyes: PERRL. Extraocular muscles intact, sclera clear   ENT: Nares without discharge, oropharynx without lesions or exudate.   Neck: Supple, no thyromegaly, bruit, or JVD.   Lymph Nodes: No adenopathy (cervical, axillary)   Cardiovascular: RRR, S1 and S2 normal. No murmur, rub, or gallop.   Lungs: Clear to auscultation bilaterally without wheezes/crackles/rhonchi. Good air movement.   Skin: No rashes or lesions noted on limited exam. Warm and dry with no erythema or pallor.  Abdomen: Normoactive bowel sounds, abdomen soft, nontender, and nondistended, no hepatosplenomegaly or masses. Liver normal in size, no rebound or guarding.   Genitourinary: Deferred   Extremities: No cyanosis, clubbing or edema. No joint effusions, FROM without tenderness.   Musculoskeletal: No spine or costovertebral angle tenderness.   Neuro: Alert and oriented to person, place, and time. Cranial nerves II-XII grossly intact, normal gait, normal sensation throughout, normal cerebellar function.  Psychiatry: Alert and oriented to person, place, and time, conversant, appropriate affect.   Nursing notes and vitals reviewed

## 2020-12-12 LAB — CMV DNA, QUANTITATIVE, PCR
CMV QUANT: <50 [IU]/mL — ABNORMAL HIGH (ref <0)
CMV VIRAL LD: DETECTED — AB

## 2020-12-12 LAB — LYMPH MARKER LIMITED,FLOW
ABSOLUTE CD3 CNT: 1491 {cells}/uL (ref 915–3400)
ABSOLUTE CD4 CNT: 798 {cells}/uL (ref 510–2320)
ABSOLUTE CD8 CNT: 672 {cells}/uL (ref 180–1520)
CD3% (T CELLS)": 71 % (ref 61–86)
CD4% (T HELPER)": 38 % (ref 34–58)
CD4:CD8 RATIO: 1.2 (ref 0.9–4.8)
CD8% T SUPPRESR": 32 % (ref 12–38)

## 2020-12-12 LAB — CMV IGG: CMV IGG: POSITIVE — AB

## 2020-12-12 LAB — SYPHILIS SCREEN: SYPHILIS AB IGG/IGM SCREEN: POSITIVE — AB

## 2020-12-12 LAB — VARICELLA ZOSTER ANTIBODY, IGG: VARICELLA ZOSTER IGG: NEGATIVE — AB

## 2020-12-13 LAB — RPR, REVERSE ALGORITHM REFLEX: SYPHILIS RPR SCREEN: REACTIVE — AB

## 2020-12-16 DIAGNOSIS — H209 Unspecified iridocyclitis: Principal | ICD-10-CM

## 2020-12-16 LAB — HIV RNA, QUANTITATIVE, PCR: HIV RNA QNT RSLT: NOT DETECTED

## 2020-12-16 MED ORDER — PENICILLIN G IVPB 4 MILLION UNITS IN 100 ML
INTRAVENOUS | 0 refills | 14 days | Status: CP
Start: 2020-12-16 — End: 2020-12-30

## 2020-12-16 NOTE — Unmapped (Signed)
Spoke to patient regarding treatment for right eye uveitis with PenG 4 million units q4 hours for 14 days via PICC

## 2020-12-18 NOTE — Unmapped (Signed)
I have called and notified pt of   PICC placement: 12/19/2020 @ 1:00pm  First Infusion: 12/19/2020 @ 1:30pm   Home Infusion: [facility]  Home Health: TBD by [facility]     Pt voiced understanding and agreed with plan. He states he has already spoken with [facility] and they will be setting up for med delivery and HH to come out on Saturday 12/21/19.

## 2020-12-19 ENCOUNTER — Ambulatory Visit: Admit: 2020-12-19 | Discharge: 2020-12-19 | Payer: MEDICARE

## 2020-12-19 ENCOUNTER — Encounter: Admit: 2020-12-19 | Discharge: 2020-12-19 | Payer: MEDICARE

## 2020-12-19 DIAGNOSIS — H209 Unspecified iridocyclitis: Principal | ICD-10-CM

## 2020-12-19 MED ADMIN — heparin preservative-free injection 10 units/mL syringe (HEPARIN LOCK FLUSH): INTRAVENOUS | @ 19:00:00 | Stop: 2020-12-19

## 2020-12-19 MED ADMIN — heparin preservative-free injection 10 units/mL syringe (HEPARIN LOCK FLUSH): 50 [IU] | INTRAVENOUS | @ 19:00:00 | Stop: 2020-12-19

## 2020-12-19 MED ADMIN — sodium chloride (NS) 0.9 % flush 10 mL: 10 mL | INTRAVENOUS | @ 19:00:00 | Stop: 2020-12-19

## 2020-12-19 MED ADMIN — penicillin G potassium 4,000,000 Units in sodium chloride (NS) 0.9 % 100 mL IVPB: 4 10*6.[IU] | INTRAVENOUS | @ 19:00:00 | Stop: 2020-12-19

## 2020-12-19 MED ADMIN — lidocaine (XYLOCAINE) 10 mg/mL (1 %) injection: @ 19:00:00 | Stop: 2020-12-19

## 2020-12-19 NOTE — Unmapped (Signed)
1425: Patient arrived to Room 261. Assisted to chair, wheels locked, call bell within reached.    1530: IV infusion complete. Patient tolerated well. PICC line flushed per protocol. Patient discharged from unit at this time.

## 2020-12-19 NOTE — Unmapped (Signed)
Line Insertion Note    Date/Time: December 19, 2020 2:19 PM    Attending: Leighton Roach, MD    Assistant(s): Halina Andreas, PA-C    Diagnosis: Infection and IV Antibotics  Procedure: Fluoroscopy guided and Ultrasound guided PICC line - single lumen insertion RUE    Time out: Prior to the procedure, a time out was performed with all team members present. During the time out, the patient, procedure, procedure site, and allergies when applicable were verbally verified by the team members and Halina Andreas, PA-C    Anesthesia: Local  Contrast Used: None  0 ml  Complications: None  Estimated Blood Loss: Minimal     Major Findings: Successful placement 42 cm single lumen PICC in the right Basilic Vein.     Plan: Catheter ready for use    The patient tolerated the procedure well without incident or complication and was returned to PRU.     See detailed procedure note with images in PACS.

## 2020-12-23 NOTE — Unmapped (Signed)
Spoke to [employer], he will let Corrie Dandy know patient needs weekly BMP

## 2020-12-23 NOTE — Unmapped (Signed)
Mary with [facility] called to ask if pt needs any labs drawn while on the 2 weeks of IV antibiotic?  Please advise.   Corrie Dandy [phone]

## 2020-12-31 MED FILL — BIKTARVY 50 MG-200 MG-25 MG TABLET: ORAL | 30 days supply | Qty: 30 | Fill #2

## 2020-12-31 NOTE — Unmapped (Signed)
[facility] Refill Coordination Note    Specialty Medication(s) to be Shipped:   Infectious Disease: Biktarvy    Other medication(s) to be shipped: No additional medications requested for fill at this time     Jermane Brayboy, DOB: 07/11/2001  Phone: [phone] (home)       All above HIPAA information was verified with patient.     Was a Nurse, learning disability used for this call? No    Completed refill call assessment today to schedule patient's medication shipment from the [facility] [phone]).       Specialty medication(s) and dose(s) confirmed: Regimen is correct and unchanged.   Changes to medications: Kerolos reports no changes at this time.  Changes to insurance: No  Questions for the pharmacist: No    Confirmed patient received Welcome Packet with first shipment. The patient will receive a drug information handout for each medication shipped and additional FDA Medication Guides as required.       DISEASE/MEDICATION-SPECIFIC INFORMATION        N/A    SPECIALTY MEDICATION ADHERENCE     Medication Adherence    Patient reported X missed doses in the last month: 0  Specialty Medication: Biktarvy  Patient is on additional specialty medications: No  Patient is on more than two specialty medications: No                Biktarvy 50-200-25 mg: 2-3 days of medicine on hand         SHIPPING     Shipping address confirmed in Epic.     Delivery Scheduled: Yes, Expected medication delivery date: 01/01/21.     Medication will be delivered via UPS to the temporary address in Epic WAM.    Nancy Nordmann [facility] Specialty Technician

## 2020-12-31 NOTE — Unmapped (Signed)
The [name] has made a second and final attempt to reach this patient to refill the following medication:Biktarvy.      We have been unable to leave messages on the following phone numbers: [phone] and have sent a MyChart message.    Dates contacted: 2/24, 3/1  Last scheduled delivery: shipped 1/27    The patient may be at risk of non-compliance with this medication. The patient should call the [facility] at [phone] (option 4) to refill medication.    Alec Hines   [facility] Specialty Technician

## 2021-01-02 NOTE — Unmapped (Signed)
Melissa with [facility] has called and left voice message regarding pt's PICC line.   She would like to know if provider would like to have PICC removed my [facility] nurse after completion of IV A/B treatment on 01/02/21?  Per the instruction of Lise Auer, [facility], I have advised Melissa to proceed with PICC removal after completion of IV A/B therapy.   She voiced understanding and agreed with plan.

## 2021-01-07 NOTE — Unmapped (Signed)
Spoke to patient about leg aches.  He has been taking Biktarvy for a year.  Discussed possibility of lactic acidosis.  He did overexert himself at the beach this weekend.  He will rest, hydrate, and apply muscle rub.  If symptoms do not resolve, he will call us back

## 2021-01-07 NOTE — Unmapped (Signed)
Patient calls to speak with Lise Auer, NP in regards to leg pain he has had since yesterday and wants to know if its related to the other problems he has. Patient states the leg pain on the lower extremity's. Patient states the pain was more severe yesterday than today but it still is aching today. Please advise

## 2021-01-22 NOTE — Unmapped (Signed)
[facility] Refill Coordination Note    Specialty Medication(s) to be Shipped:   Infectious Disease: Biktarvy    Other medication(s) to be shipped: No additional medications requested for fill at this time     Alec Hines, DOB: 04-30-2001  Phone: [phone] (home)       All above HIPAA information was verified with patient.     Was a Nurse, learning disability used for this call? No    Completed refill call assessment today to schedule patient's medication shipment from the [facility] [phone]).       Specialty medication(s) and dose(s) confirmed: Regimen is correct and unchanged.   Changes to medications: Alec Hines reports no changes at this time.  Changes to insurance: No  Questions for the pharmacist: No    Confirmed patient received a Conservation officer, historic buildings and a Surveyor, mining with first shipment. The patient will receive a drug information handout for each medication shipped and additional FDA Medication Guides as required.       DISEASE/MEDICATION-SPECIFIC INFORMATION        N/A    SPECIALTY MEDICATION ADHERENCE     Medication Adherence    Patient reported X missed doses in the last month: 0  Specialty Medication: biktarvy 50-200-25mg   Patient is on additional specialty medications: No  Patient is on more than two specialty medications: No  Any gaps in refill history greater than 2 weeks in the last 3 months: no  Demonstrates understanding of importance of adherence: yes  Informant: patient  Reliability of informant: reliable  Provider-estimated medication adherence level: good                biktarvy 50-200-25 mg: 10 days of medicine on hand         SHIPPING     Shipping address confirmed in Epic.     Delivery Scheduled: Yes, Expected medication delivery date: 03/30.     Medication will be delivered via UPS to the temporary address in Epic WAM.    Alec Hines   [facility] Specialty Technician

## 2021-01-25 MED ORDER — PREDNISOLONE ACETATE 1 % EYE DROPS,SUSPENSION
0 days
Start: 2021-01-25 — End: 2021-03-10

## 2021-01-25 MED ORDER — ATROPINE 1 % EYE DROPS
0 days
Start: 2021-01-25 — End: ?

## 2021-01-27 MED FILL — BIKTARVY 50 MG-200 MG-25 MG TABLET: ORAL | 30 days supply | Qty: 30 | Fill #3

## 2021-02-11 NOTE — Unmapped (Signed)
Tina called and needs a refill on his clindamycin (CLEOCIN T) 1 % lotion. And, he would for [facility] [address] [facility] [place], [place]. He has been using it twice a day as instructed by Larita Fife and is seeing very good results. I did explain that Larita Fife is on vacation till Monday 4/18. So I'm not sure if anyone can refill it before that time. That I would send the message to request.

## 2021-02-17 DIAGNOSIS — B20 Human immunodeficiency virus [HIV] disease: Principal | ICD-10-CM

## 2021-02-17 DIAGNOSIS — R21 Rash and other nonspecific skin eruption: Principal | ICD-10-CM

## 2021-02-17 DIAGNOSIS — L739 Follicular disorder, unspecified: Principal | ICD-10-CM

## 2021-02-17 DIAGNOSIS — Z21 Asymptomatic human immunodeficiency virus [HIV] infection status: Principal | ICD-10-CM

## 2021-02-17 DIAGNOSIS — L709 Acne, unspecified: Principal | ICD-10-CM

## 2021-02-17 DIAGNOSIS — Z717 Human immunodeficiency virus [HIV] counseling: Principal | ICD-10-CM

## 2021-02-17 DIAGNOSIS — Z7252 High risk homosexual behavior: Principal | ICD-10-CM

## 2021-02-17 MED ORDER — CLINDAMYCIN 1 % LOTION
Freq: Two times a day (BID) | TOPICAL | 2 refills | 0.00000 days | Status: CP
Start: 2021-02-17 — End: 2022-02-17

## 2021-02-17 NOTE — Unmapped (Signed)
Trygg called again and really needs the lotion. Please send that this morning if possible. [facility] [address] [facility] [place]. Thanks

## 2021-02-26 NOTE — Unmapped (Signed)
Pt called on the afternoon of 02/25/21 to report his Clindamycin 1% lotion is going to cost him $80.00.  I have advised pt I will investigate and call him back.     I have called pt to make him aware of using [facility] and the [facility]  Pt voiced understanding and agreed with plan.

## 2021-03-02 NOTE — Unmapped (Signed)
The [facility] has made a third and final attempt to reach this patient to refill the following medication: Biktarvy.      We have been unable to leave messages on the following phone numbers: [phone] and have sent a MyChart message.    Dates contacted: 4/22, 4/26, 5/2  Last scheduled delivery: shipped 3/29    The patient may be at risk of non-compliance with this medication. The patient should call the [facility] at [phone] (option 4) to refill medication.    [name]   [facility] Specialty Technician

## 2021-03-02 NOTE — Unmapped (Signed)
Pt called and would like to have his Clindimyacin Lotion sent to [facility]. He states he received the Hibilclens from them previously

## 2021-03-02 NOTE — Unmapped (Signed)
[facility] Refill Coordination Note    Specialty Medication(s) to be Shipped:   Infectious Disease: Biktarvy    Other medication(s) to be shipped: No additional medications requested for fill at this time     Alec Hines, DOB: July 13, 2001  Phone: [phone] (home)       All above HIPAA information was verified with patient.     Was a Nurse, learning disability used for this call? No    Completed refill call assessment today to schedule patient's medication shipment from the [facility] [phone]).  All relevant notes have been reviewed.     Specialty medication(s) and dose(s) confirmed: Regimen is correct and unchanged.   Changes to medications: Alec Hines reports no changes at this time.  Changes to insurance: No  New side effects reported not previously addressed with a pharmacist or physician: None reported  Questions for the pharmacist: No    Confirmed patient received a Conservation officer, historic buildings and a Surveyor, mining with first shipment. The patient will receive a drug information handout for each medication shipped and additional FDA Medication Guides as required.       DISEASE/MEDICATION-SPECIFIC INFORMATION        N/A    SPECIALTY MEDICATION ADHERENCE     Medication Adherence    Patient reported X missed doses in the last month: 0  Specialty Medication: biktarvy 50-200-25mg   Patient is on additional specialty medications: No  Patient is on more than two specialty medications: No              Were doses missed due to medication being on hold? No    Biktarvy  4 days worth of medication on hand.        REFERRAL TO PHARMACIST     Referral to the pharmacist: Not needed      [facility]     Shipping address confirmed in Epic.     Delivery Scheduled: Yes, Expected medication delivery date: 03/04/21.     Medication will be delivered via UPS to the prescription address in Epic WAM.    Alec Hines   [facility] Specialty Technician

## 2021-03-03 DIAGNOSIS — Z21 Asymptomatic human immunodeficiency virus [HIV] infection status: Principal | ICD-10-CM

## 2021-03-03 DIAGNOSIS — L709 Acne, unspecified: Principal | ICD-10-CM

## 2021-03-03 DIAGNOSIS — R21 Rash and other nonspecific skin eruption: Principal | ICD-10-CM

## 2021-03-03 DIAGNOSIS — B20 Human immunodeficiency virus [HIV] disease: Principal | ICD-10-CM

## 2021-03-03 DIAGNOSIS — Z717 Human immunodeficiency virus [HIV] counseling: Principal | ICD-10-CM

## 2021-03-03 DIAGNOSIS — L739 Follicular disorder, unspecified: Principal | ICD-10-CM

## 2021-03-03 DIAGNOSIS — Z7252 High risk homosexual behavior: Principal | ICD-10-CM

## 2021-03-03 MED ORDER — CLINDAMYCIN 1 % LOTION
Freq: Two times a day (BID) | TOPICAL | 2 refills | 0 days | Status: CP
Start: 2021-03-03 — End: 2022-03-03

## 2021-03-03 MED FILL — BIKTARVY 50 MG-200 MG-25 MG TABLET: ORAL | 30 days supply | Qty: 30 | Fill #4

## 2021-03-03 NOTE — Unmapped (Signed)
Patient called to ask if prescription had been sent in yet. Ms Alec Hines wanted to confirm which pharmacy. Per Tula Nakayama he wants it to go to [facility]. Per patient he didn't have a co pay with SS. [facility] $[facility] co pay. Ms Alec Hines confirmed she will send it now.

## 2021-03-10 ENCOUNTER — Encounter: Admit: 2021-03-10 | Discharge: 2021-03-11 | Payer: BLUE CROSS/BLUE SHIELD

## 2021-03-10 ENCOUNTER — Encounter: Admit: 2021-03-10 | Discharge: 2021-03-11 | Payer: MEDICARE | Attending: Family | Primary: Family

## 2021-03-10 DIAGNOSIS — Z21 Asymptomatic human immunodeficiency virus [HIV] infection status: Principal | ICD-10-CM

## 2021-03-10 DIAGNOSIS — H5789 Other specified disorders of eye and adnexa: Principal | ICD-10-CM

## 2021-03-10 DIAGNOSIS — L739 Follicular disorder, unspecified: Principal | ICD-10-CM

## 2021-03-10 DIAGNOSIS — H539 Unspecified visual disturbance: Principal | ICD-10-CM

## 2021-03-10 DIAGNOSIS — Z7252 High risk homosexual behavior: Principal | ICD-10-CM

## 2021-03-10 DIAGNOSIS — L709 Acne, unspecified: Principal | ICD-10-CM

## 2021-03-10 DIAGNOSIS — R21 Rash and other nonspecific skin eruption: Principal | ICD-10-CM

## 2021-03-10 DIAGNOSIS — B20 Human immunodeficiency virus [HIV] disease: Principal | ICD-10-CM

## 2021-03-10 DIAGNOSIS — Z717 Human immunodeficiency virus [HIV] counseling: Principal | ICD-10-CM

## 2021-03-10 LAB — COMPREHENSIVE METABOLIC PANEL
ALBUMIN: 3.8 g/dL (ref 3.4–5.0)
ALKALINE PHOSPHATASE: 41 U/L — ABNORMAL LOW (ref 46–116)
ALT (SGPT): 17 U/L (ref 10–49)
ANION GAP: 6 mmol/L (ref 3–11)
AST (SGOT): 20 U/L (ref <34)
BILIRUBIN TOTAL: 0.4 mg/dL (ref 0.3–1.2)
BLOOD UREA NITROGEN: 9 mg/dL (ref 9–23)
BUN / CREAT RATIO: 9
CALCIUM: 9.4 mg/dL (ref 8.7–10.4)
CHLORIDE: 109 mmol/L — ABNORMAL HIGH (ref 98–107)
CO2: 28 mmol/L (ref 20.0–31.0)
CREATININE: 1 mg/dL (ref 0.60–1.10)
EGFR CKD-EPI AA MALE: >90 mL/min/{1.73_m2}
EGFR CKD-EPI NON-AA MALE: >90 mL/min/{1.73_m2}
GLUCOSE RANDOM: 80 mg/dL (ref 70–179)
POTASSIUM: 4.4 mmol/L (ref 3.5–5.1)
PROTEIN TOTAL: 7.1 g/dL (ref 5.7–8.2)
SODIUM: 143 mmol/L (ref 136–145)

## 2021-03-10 LAB — CBC W/ AUTO DIFF
BASOPHILS ABSOLUTE COUNT: 0 10*9/L (ref 0.0–0.1)
BASOPHILS RELATIVE PERCENT: 0.4 %
EOSINOPHILS ABSOLUTE COUNT: 0 10*9/L (ref 0.0–0.7)
EOSINOPHILS RELATIVE PERCENT: 0.8 %
HEMATOCRIT: 43.9 % (ref 38.0–50.0)
HEMOGLOBIN: 14.4 g/dL (ref 13.5–17.5)
LYMPHOCYTES ABSOLUTE COUNT: 1.9 10*9/L (ref 0.7–4.0)
LYMPHOCYTES RELATIVE PERCENT: 32 %
MEAN CORPUSCULAR HEMOGLOBIN CONC: 32.7 g/dL (ref 30.0–36.0)
MEAN CORPUSCULAR HEMOGLOBIN: 28.8 pg (ref 26.0–34.0)
MEAN CORPUSCULAR VOLUME: 88.2 fL (ref 81.0–95.0)
MEAN PLATELET VOLUME: 8.8 fL (ref 7.0–10.0)
MONOCYTES ABSOLUTE COUNT: 0.2 10*9/L (ref 0.1–1.0)
MONOCYTES RELATIVE PERCENT: 3.5 %
NEUTROPHILS ABSOLUTE COUNT: 3.7 10*9/L (ref 1.7–7.7)
NEUTROPHILS RELATIVE PERCENT: 63.3 %
PLATELET COUNT: 286 10*9/L (ref 150–450)
RED BLOOD CELL COUNT: 4.98 10*12/L (ref 4.32–5.72)
RED CELL DISTRIBUTION WIDTH: 16.7 % — ABNORMAL HIGH (ref 12.0–15.0)
WBC ADJUSTED: 5.9 10*9/L (ref 3.5–10.5)

## 2021-03-10 LAB — HEPATITIS B SURFACE ANTIGEN: HEPATITIS B SURFACE ANTIGEN: NONREACTIVE

## 2021-03-10 MED ORDER — CLINDAMYCIN 1 % LOTION
Freq: Two times a day (BID) | TOPICAL | 2 refills | 0 days | Status: CP
Start: 2021-03-10 — End: 2022-03-10
  Filled 2021-03-20: qty 60, 15d supply, fill #0

## 2021-03-10 NOTE — Unmapped (Signed)
Assessment/Plan:      Diagnosis ICD-10-CM Associated Orders   1. Human immunodeficiency virus (HIV) disease (CMS-HCC)  B20 CBC w/ Differential     Metabolic panel, Comp (Chem10 + LFTs)     HIV RNA, Quantitative, PCR     Lymphocyte Markers Limited     Hepatitis B Surface Antigen     CMV DNA, quantitative, PCR     Syphilis Screen     clindamycin (CLEOCIN T) 1 % lotion     Toxoplasma gondii Antibody, IgG   2. HIV antibody positive (CMS-HCC)  Z21 CBC w/ Differential     Metabolic panel, Comp (Chem10 + LFTs)     HIV RNA, Quantitative, PCR     Lymphocyte Markers Limited     Hepatitis B Surface Antigen     CMV DNA, quantitative, PCR     Syphilis Screen     clindamycin (CLEOCIN T) 1 % lotion     Toxoplasma gondii Antibody, IgG   3. Encounter for HIV counseling  Z71.7 CBC w/ Differential     Metabolic panel, Comp (Chem10 + LFTs)     HIV RNA, Quantitative, PCR     Lymphocyte Markers Limited     Hepatitis B Surface Antigen     CMV DNA, quantitative, PCR     Syphilis Screen     clindamycin (CLEOCIN T) 1 % lotion     Toxoplasma gondii Antibody, IgG   4. High risk homosexual behavior  Z72.52 CBC w/ Differential     Metabolic panel, Comp (Chem10 + LFTs)     HIV RNA, Quantitative, PCR     Lymphocyte Markers Limited     Hepatitis B Surface Antigen     CMV DNA, quantitative, PCR     Syphilis Screen     clindamycin (CLEOCIN T) 1 % lotion     Toxoplasma gondii Antibody, IgG   5. Rash and nonspecific skin eruption  R21 CBC w/ Differential     Metabolic panel, Comp (Chem10 + LFTs)     HIV RNA, Quantitative, PCR     Lymphocyte Markers Limited     Hepatitis B Surface Antigen     CMV DNA, quantitative, PCR     Syphilis Screen     clindamycin (CLEOCIN T) 1 % lotion     Toxoplasma gondii Antibody, IgG   6. Acne, unspecified acne type  L70.9 CBC w/ Differential     Metabolic panel, Comp (Chem10 + LFTs)     HIV RNA, Quantitative, PCR     Lymphocyte Markers Limited     Hepatitis B Surface Antigen     CMV DNA, quantitative, PCR     Syphilis Screen     clindamycin (CLEOCIN T) 1 % lotion     Toxoplasma gondii Antibody, IgG   7. Eye inflammation  H57.89 CBC w/ Differential     Metabolic panel, Comp (Chem10 + LFTs)     HIV RNA, Quantitative, PCR     Lymphocyte Markers Limited     Hepatitis B Surface Antigen     CMV DNA, quantitative, PCR     Syphilis Screen     clindamycin (CLEOCIN T) 1 % lotion     Toxoplasma gondii Antibody, IgG   8. Vision changes  H53.9 CBC w/ Differential     Metabolic panel, Comp (Chem10 + LFTs)     HIV RNA, Quantitative, PCR     Lymphocyte Markers Limited     Hepatitis B Surface Antigen     CMV DNA, quantitative,  PCR     Syphilis Screen     clindamycin (CLEOCIN T) 1 % lotion     Toxoplasma gondii Antibody, IgG   9. Folliculitis  L73.9 CBC w/ Differential     Metabolic panel, Comp (Chem10 + LFTs)     HIV RNA, Quantitative, PCR     Lymphocyte Markers Limited     Hepatitis B Surface Antigen     CMV DNA, quantitative, PCR     Syphilis Screen     clindamycin (CLEOCIN T) 1 % lotion     Toxoplasma gondii Antibody, IgG       Return in about 3 months (around 06/10/2021).    HPI:     Alec Hines is a 20 y.o. year old Black or [place] American male who presents today for HIV follow-up.  Patient recently tested positive for HIV on 02/22/20. ??He was taking Truvada PrEP and went for follow up testing and HIV antibody returned positive. ??Patient has notified sexual contacts. ??Has been reported by [facility]. Patient was infected through homosexual contact about??12??months ago. Discussed HIV biology, medication compliance, resistance, side effects, safe sex with condoms, follow up appointments. Recently tested positive for chlamydia, treated. ??Patient chose to start HAART Biktarvy. ??Last HIV RNA??not detected CD4% 38 Absolute CD4??791.????Has been taking medication with no missed doses.  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 ??He does have rash on both arms and trunk that is improving.  Has some darkened skin on his nose.  He will see Dermatology for evaluation.  Started him on topical remedies for acne/rash clobetisol for facial acne, improving.????He has seen Optmetrist for left eye pain and inflammation. No longer using eyedrops.  Vision is improving.  Will check additional labs for CMV, toxoplasmosis, quantiferon, AFB blood culture, HSV.  Will collect HIV labs today. 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  Follow up in??3??months.?? ??    ??    Past Medical/Surgical History:     Past Medical History:   Diagnosis Date   ??? Chlamydia    ??? HIV disease (CMS-HCC)      No past surgical history on file.    Social History:     Social History     Socioeconomic History   ??? Marital status: Single     Spouse name: None   ??? Number of children: None   ??? Years of education: None   ??? Highest education level: None   Occupational History   ??? None   Tobacco Use   ??? Smoking status: Former Smoker     Types: e-Cigarettes   ??? Smokeless tobacco: Never Used   Vaping Use   ??? Vaping Use: Former   ??? Quit date: 12/04/2020   ??? Substances: Nicotine, Flavoring   Substance and Sexual Activity   ??? Alcohol use: Yes     Comment: every now and then   ??? Drug use: Yes     Types: Marijuana   ??? Sexual activity: None   Other Topics Concern   ??? None   Social History Narrative   ??? None     Social Determinants of Health     Financial Resource Strain: Not on file   Food Insecurity: Not on file   Transportation Needs: Not on file   Physical Activity: Not on file   Stress: Not on file   Social Connections: Not on file       Family History:     Family History  Problem Relation Age of Onset   ??? No Known Problems Mother    ??? No Known Problems Father    ??? No Known Problems Brother        Allergies:     Patient has no known allergies.    Current Medications:     Current Outpatient Medications   Medication Sig Dispense Refill   ??? atropine 1 % ophthalmic solution PLACE 1 DROP INTO THE LEFT EYE 2 TIMES DAILY.     ??? bictegrav-emtricit-tenofov ala (BIKTARVY) 50-200-25 mg tablet Take 1 tablet by mouth daily. 30 tablet 11   ??? chlorhexidine (HIBICLENS) 4 % external liquid Apply to affected areas once a week.  Allow to dry 1 hour then shower off. 118 mL 0   ??? predniSONE (DELTASONE) 10 MG tablet TAKE 6 TABLETS BY MOUTH DAILY.     ??? clindamycin (CLEOCIN T) 1 % lotion Apply topically Two (2) times a day. 60 mL 2     No current facility-administered medications for this visit.       ROS:     Constitutional: Negative for fever, chills, appetite change, fatigue and unexpected weight change.   HEENT:  Head: Negative for headache, head injury, dizziness, lightheadedness.  Eyes: Glasses or contact lenses.  Negative for pain, redness, excessive tearing, double or blurred vision, spots, specks, flashing lights, glaucoma, cataracts, photophobia.  Ears:  Negative for hearing change, tinnitus, vertigo, earache, infection, discharge.  Nose and sinuses: Negative for frequent colds, nasal stuffiness, discharge or itching, hay fever, nosebleeds.  Throat: Negative for dental abscess, bleeding gums, dentures, sore tongue, dry mouth, frequent sore throats, hoarseness.    Neck:  Negative for lumps, swollen glands, goiter, pain, stiffness  Respiratory: Negative for cough, sputum, hemoptysis, dyspnea, wheezing, pleurisy, last chest x-ray.  Negative for asthma, bronchitis, emphysema, pneumonia, tuberculosis.??   Cardiovascular: Negative for chest pain, palpitations, hypertension, rheumatic fever, heart murmurs, dyspnea, orthopnea, paroxysmal nocturnal dyspnea, edema, and leg swelling.   Gastrointestinal: Negative for dysphagia, nausea, vomiting, abdominal pain, diarrhea, constipation, blood in stool, abdominal distention, heartburn, appetite change, food intolerance, and anal bleeding.   Endocrine: Negative for cold intolerance, heat intolerance, polydipsia, polyphagia and polyuria.   Genitourinary: Negative for dysuria, urgency, frequency, flank pain, difficulty urinating, hematuria, hesitancy, dribbling, difficulty starting/stopping stream, and genital sores.   Musculoskeletal: Negative for myalgias, back pain, joint swelling, arthralgias, gait problem and neck stiffness. ??  Skin: Negative for color change, pallor, rash, itching, and wound.   Allergic/Immunologic: Negative for environmental allergies and food allergies.   Neurological: Negative for dizziness, tremors, seizures, syncope, speech difficulty, weakness, light-headedness, numbness and headaches.  Negative for changes in mood, attention, orientation, or speech.   Hematological: Negative for adenopathy. Does not bruise/bleed easily.  Negative transfusion reactions.   Psychiatric/Behavioral: Negative for suicidal ideas, hallucinations, behavioral problems, confusion, sleep disturbance, self-injury, dyphoric mood and agitation. The patient is not nervous/anxious.??     Vital Signs:     Vitals:    03/10/21 1053   BP: 107/71   Pulse: 73   Resp: 16   Temp: 36.3 ??C (97.4 ??F)   SpO2: 98%     BP Readings from Last 3 Encounters:   03/10/21 107/71   12/11/20 102/65   11/07/20 101/67     Wt Readings from Last 3 Encounters:   03/10/21 75.8 kg (167 lb)   12/11/20 70.8 kg (156 lb) (51 %, Z= 0.02)*   11/07/20 67.1 kg (148 lb) (38 %, Z= -0.31)*     *  Growth percentiles are based on CDC (Boys, 2-20 Years) data.       Immunizations:       There is no immunization history on file for this patient.    Labs:     Platelet   Date Value Ref Range Status   12/11/2020 300 150 - 450 10*9/L Final     HGB   Date Value Ref Range Status   12/11/2020 11.8 (L) 13.5 - 17.5 g/dL Final     WBC   Date Value Ref Range Status   12/11/2020 8.8 3.5 - 10.5 10*9/L Final     BUN   Date Value Ref Range Status   12/11/2020 12 9 - 23 mg/dL Final     Creatinine   Date Value Ref Range Status   12/11/2020 0.84 0.70 - 1.30 mg/dL Final     ALT   Date Value Ref Range Status   12/11/2020 47 10 - 49 U/L Final     AST   Date Value Ref Range Status   12/11/2020 28 <34 U/L Final     Potassium   Date Value Ref Range Status   12/11/2020 3.5 3.5 - 5.1 mmol/L Final     CO2   Date Value Ref Range Status   12/11/2020 25.0 20.0 - 31.0 mmol/L Final     HIV RNA   Date Value Ref Range Status   10/16/2020 32,305 (H) <0 copies/mL Final     Hepatitis C Ab   Date Value Ref Range Status   03/05/2020 Nonreactive Nonreactive Final     Gonorrhoeae NAA   Date Value Ref Range Status   03/05/2020 Negative Negative Final     Chlamydia trachomatis, NAA   Date Value Ref Range Status   03/05/2020 Negative Negative Final     CD4% (T Helper)   Date Value Ref Range Status   12/11/2020 38 34 - 58 % Final         Physical Exam:     General: Well-appearing, calm, well-groomed, well-nourished.  No distress  Eyes: PERRL. Extraocular muscles intact, sclera clear   ENT: Nares without discharge, oropharynx without lesions or exudate.   Neck: Supple, no thyromegaly, bruit, or JVD.   Lymph Nodes: No adenopathy (cervical, axillary)   Cardiovascular: RRR, S1 and S2 normal. No murmur, rub, or gallop.   Lungs: Clear to auscultation bilaterally without wheezes/crackles/rhonchi. Good air movement.   Skin: No rashes or lesions noted on limited exam. Warm and dry with no erythema or pallor.  Abdomen: Normoactive bowel sounds, abdomen soft, nontender, and nondistended, no hepatosplenomegaly or masses. Liver normal in size, no rebound or guarding.   Genitourinary: Deferred   Extremities: No cyanosis, clubbing or edema. No joint effusions, FROM without tenderness.   Musculoskeletal: No spine or costovertebral angle tenderness.   Neuro: Alert and oriented to person, place, and time. Cranial nerves II-XII grossly intact, normal gait, normal sensation throughout, normal cerebellar function.  Psychiatry: Alert and oriented to person, place, and time, conversant, appropriate affect.   Nursing notes and vitals reviewed

## 2021-03-11 LAB — LYMPH MARKER LIMITED,FLOW
ABSOLUTE CD3 CNT: 1102 {cells}/uL (ref 915–3400)
ABSOLUTE CD4 CNT: 513 {cells}/uL (ref 510–2320)
ABSOLUTE CD8 CNT: 570 {cells}/uL (ref 180–1520)
CD3% (T CELLS): 58 % — ABNORMAL LOW (ref 61–86)
CD4% (T HELPER): 27 % — ABNORMAL LOW (ref 34–58)
CD4:CD8 RATIO: 0.9 (ref 0.9–4.8)
CD8% T SUPPRESR: 30 % (ref 12–38)

## 2021-03-11 LAB — SYPHILIS SCREEN: SYPHILIS AB IGG/IGM SCREEN: POSITIVE — AB

## 2021-03-11 LAB — TOXOPLASMA GONDII ANTIBODY, IGG: TOXOPLASMA GONDII IGG: NEGATIVE

## 2021-03-12 LAB — HIV RNA, QUANTITATIVE, PCR: HIV RNA QNT RSLT: NOT DETECTED

## 2021-03-12 LAB — RPR, REVERSE ALGORITHM REFLEX: RPR TITER REFLEX: 1:8 {titer} — AB

## 2021-03-13 LAB — CMV DNA, QUANTITATIVE, PCR: CMV VIRAL LD: NOT DETECTED

## 2021-04-01 NOTE — Unmapped (Signed)
[facility] Clinical Assessment & Refill Coordination Note    Alec Hines, Alec Hines: Mar 01, 2001  Phone: [phone] (home)     All above HIPAA information was verified with patient.     Was a Nurse, learning disability used for this call? No    Specialty Medication(s):   Infectious Disease: Biktarvy     Current Outpatient Medications   Medication Sig Dispense Refill   ??? atropine 1 % ophthalmic solution PLACE 1 DROP INTO THE LEFT EYE 2 TIMES DAILY.     ??? bictegrav-emtricit-tenofov ala (BIKTARVY) 50-200-25 mg tablet Take 1 tablet by mouth daily. 30 tablet 11   ??? chlorhexidine (HIBICLENS) 4 % external liquid Apply to affected areas once a week.  Allow to dry 1 hour then shower off. 118 mL 0   ??? clindamycin (CLEOCIN T) 1 % lotion Apply topically Two (2) times a day. 60 mL 2   ??? predniSONE (DELTASONE) 10 MG tablet TAKE 6 TABLETS BY MOUTH DAILY.       No current facility-administered medications for this visit.        Changes to medications: Alec Hines reports no changes at this time.    No Known Allergies    Changes to allergies: No    SPECIALTY MEDICATION ADHERENCE     Biktarvy   : 2-3 days of medicine on hand     Medication Adherence    Patient reported X missed doses in the last month: 0  Specialty Medication: Biktarvy  Patient is on additional specialty medications: No  Any gaps in refill history greater than 2 weeks in the last 3 months: no  Demonstrates understanding of importance of adherence: yes  Informant: patient  Provider-estimated medication adherence level: good  Patient is at risk for Non-Adherence: No          Specialty medication(s) dose(s) confirmed: Regimen is correct and unchanged.     Are there any concerns with adherence? No    Adherence counseling provided? Not needed    CLINICAL MANAGEMENT AND INTERVENTION      Clinical Benefit Assessment:    Do you feel the medicine is effective or helping your condition? Yes    HIV ASSOCIATED LABS:     Lab Results   Component Value Date/Time HIVRS Not Detected 03/10/2021 12:03 PM    HIVRS Not Detected 12/11/2020 09:40 AM    HIVRS Not Detected 04/07/2020 09:38 AM    HIVCP 32,305 (H) 10/16/2020 12:27 PM    HIVCP 160,599 (H) 03/05/2020 03:03 PM    ACD4 513 03/10/2021 12:03 PM    ACD4 798 12/11/2020 09:40 AM    ACD4 600 10/16/2020 12:27 PM       Clinical Benefit counseling provided? Labs from 03/10/21 show evidence of clinical benefit    Adverse Effects Assessment:    Are you experiencing any side effects? No    Are you experiencing difficulty administering your medicine? No    Quality of Life Assessment:    How many days over the past month did your HIV  keep you from your normal activities? For example, brushing your teeth or getting up in the morning. 0    Have you discussed this with your provider? Not needed    Acute Infection Status:    Acute infections noted within Epic:  No active infections  Patient reported infection: None    Therapy Appropriateness:    Is therapy appropriate? Yes, therapy is appropriate and should be continued    DISEASE/MEDICATION-SPECIFIC INFORMATION  N/A    PATIENT SPECIFIC NEEDS     - Does the patient have any physical, cognitive, or cultural barriers? No    - Is the patient high risk? No    - Does the patient require a Care Management Plan? No     - Does the patient require physician intervention or other additional services (i.e. nutrition, smoking cessation, social work)? No      SHIPPING     Specialty Medication(s) to be Shipped:   Infectious Disease: Biktarvy    Other medication(s) to be shipped: No additional medications requested for fill at this time     Changes to insurance: No    Delivery Scheduled: Yes, Expected medication delivery date: 04/02/21.     Medication will be delivered via Same Day Courier to the confirmed prescription address in [facility].    The patient will receive a drug information handout for each medication shipped and additional FDA Medication Guides as required.  Verified that patient has previously received a Conservation officer, historic buildings and a Surveyor, mining.    The patient or caregiver noted above participated in the development of this care plan and knows that they can request review of or adjustments to the care plan at any time.      All of the patient's questions and concerns have been addressed.    [name]   [facility]

## 2021-04-02 MED FILL — BIKTARVY 50 MG-200 MG-25 MG TABLET: ORAL | 30 days supply | Qty: 30 | Fill #5

## 2021-04-21 ENCOUNTER — Ambulatory Visit: Payer: Self-pay | Attending: Family

## 2021-04-21 DIAGNOSIS — Z23 Encounter for immunization: Secondary | ICD-10-CM

## 2021-04-21 NOTE — Progress Notes (Signed)
   Covid-19 Vaccination Clinic  Name:  Alan Joseph    MRN: [id number] DOB: Sep 22, 2001  04/21/2021  Alan Joseph was observed post Covid-19 immunization for 15 minutes without incident. He was provided with Vaccine Information Sheet and instruction to access the V-Safe system.   Alan Joseph was instructed to call 911 with any severe reactions post vaccine: Difficulty breathing  Swelling of face and throat  A fast heartbeat  A bad rash all over body  Dizziness and weakness   Immunizations Administered     Name Date Dose VIS Date Route   PFIZER Comrnaty(Gray TOP) Covid-19 Vaccine 04/21/2021  4:33 PM 0.3 mL 10/09/2020 Intramuscular   Manufacturer: [employer], [employer]   Lot: [id number]   NDC: [phone]

## 2021-04-21 NOTE — Unmapped (Signed)
[facility] Refill Coordination Note    Specialty Medication(s) to be Shipped:   Infectious Disease: Biktarvy    Other medication(s) to be shipped: No additional medications requested for fill at this time     Alec Hines, DOB: 02/06/2001  Phone: [phone] (home)       All above HIPAA information was verified with patient.     Was a Nurse, learning disability used for this call? No    Completed refill call assessment today to schedule patient's medication shipment from the [facility] [phone]).  All relevant notes have been reviewed.     Specialty medication(s) and dose(s) confirmed: Regimen is correct and unchanged.   Changes to medications: Alec Hines reports no changes at this time.  Changes to insurance: No  New side effects reported not previously addressed with a pharmacist or physician: None reported  Questions for the pharmacist: No    Confirmed patient received a Conservation officer, historic buildings and a Surveyor, mining with first shipment. The patient will receive a drug information handout for each medication shipped and additional FDA Medication Guides as required.       DISEASE/MEDICATION-SPECIFIC INFORMATION        N/A    SPECIALTY MEDICATION ADHERENCE     Medication Adherence    Patient reported X missed doses in the last month: 0  Specialty Medication: biktarvy 50-200-25mg   Patient is on additional specialty medications: No  Patient is on more than two specialty medications: No  Any gaps in refill history greater than 2 weeks in the last 3 months: no  Demonstrates understanding of importance of adherence: yes  Informant: patient  Reliability of informant: reliable  Provider-estimated medication adherence level: good  Patient is at risk for Non-Adherence: No              Were doses missed due to medication being on hold? No    biktarvy 50-200/25 mg: 12 days of medicine on hand         REFERRAL TO PHARMACIST     Referral to the pharmacist: Not needed      [facility]     Shipping address confirmed in Epic.     Delivery Scheduled: Yes, Expected medication delivery date: 06/29.     Medication will be delivered via Next Day Courier to the prescription address in Epic [facility].    Alec Hines   [facility] Specialty Technician

## 2021-04-28 MED FILL — BIKTARVY 50 MG-200 MG-25 MG TABLET: ORAL | 30 days supply | Qty: 30 | Fill #6

## 2021-05-22 NOTE — Unmapped (Signed)
[facility] Refill Coordination Note    Specialty Medication(s) to be Shipped:   Infectious Disease: Biktarvy    Other medication(s) to be shipped: No additional medications requested for fill at this time     Alec Hines, DOB: 08/12/2001  Phone: [phone] (home)       All above HIPAA information was verified with patient.     Was a Nurse, learning disability used for this call? No    Completed refill call assessment today to schedule patient's medication shipment from the [facility] [phone]).  All relevant notes have been reviewed.     Specialty medication(s) and dose(s) confirmed: Regimen is correct and unchanged.   Changes to medications: Alec Hines reports no changes at this time.  Changes to insurance: No  New side effects reported not previously addressed with a pharmacist or physician: None reported  Questions for the pharmacist: No    Confirmed patient received a Conservation officer, historic buildings and a Surveyor, mining with first shipment. The patient will receive a drug information handout for each medication shipped and additional FDA Medication Guides as required.       DISEASE/MEDICATION-SPECIFIC INFORMATION        N/A    SPECIALTY MEDICATION ADHERENCE     Medication Adherence    Patient reported X missed doses in the last month: 0  Specialty Medication: biltarvy 50-200-25mg   Patient is on additional specialty medications: No  Patient is on more than two specialty medications: No  Any gaps in refill history greater than 2 weeks in the last 3 months: no  Demonstrates understanding of importance of adherence: yes  Informant: patient  Reliability of informant: reliable  Provider-estimated medication adherence level: good  Patient is at risk for Non-Adherence: No              Were doses missed due to medication being on hold? No    biktarvy  50-200-25 mg: 7 days of medicine on hand         REFERRAL TO PHARMACIST     Referral to the pharmacist: Not needed      [facility]     Shipping address confirmed in Epic.     Delivery Scheduled: Yes, Expected medication delivery date: 07/26.     Medication will be delivered via UPS to the prescription address in Epic W[facility].    Alec Hines   [facility] Specialty Technician

## 2021-05-25 MED FILL — BIKTARVY 50 MG-200 MG-25 MG TABLET: ORAL | 30 days supply | Qty: 30 | Fill #7

## 2021-06-10 ENCOUNTER — Ambulatory Visit: Admit: 2021-06-10 | Discharge: 2021-06-11

## 2021-06-10 ENCOUNTER — Ambulatory Visit: Admit: 2021-06-10 | Discharge: 2021-06-11 | Attending: Family | Primary: Family

## 2021-06-10 DIAGNOSIS — A530 Latent syphilis, unspecified as early or late: Principal | ICD-10-CM

## 2021-06-10 DIAGNOSIS — Z79899 Other long term (current) drug therapy: Principal | ICD-10-CM

## 2021-06-10 DIAGNOSIS — Z717 Human immunodeficiency virus [HIV] counseling: Principal | ICD-10-CM

## 2021-06-10 DIAGNOSIS — H539 Unspecified visual disturbance: Principal | ICD-10-CM

## 2021-06-10 DIAGNOSIS — R21 Rash and other nonspecific skin eruption: Principal | ICD-10-CM

## 2021-06-10 DIAGNOSIS — Z7252 High risk homosexual behavior: Principal | ICD-10-CM

## 2021-06-10 DIAGNOSIS — B259 Cytomegaloviral disease, unspecified: Principal | ICD-10-CM

## 2021-06-10 DIAGNOSIS — L709 Acne, unspecified: Principal | ICD-10-CM

## 2021-06-10 DIAGNOSIS — B20 Human immunodeficiency virus [HIV] disease: Principal | ICD-10-CM

## 2021-06-10 DIAGNOSIS — A630 Anogenital (venereal) warts: Principal | ICD-10-CM

## 2021-06-10 DIAGNOSIS — H5789 Other specified disorders of eye and adnexa: Principal | ICD-10-CM

## 2021-06-10 DIAGNOSIS — Z21 Asymptomatic human immunodeficiency virus [HIV] infection status: Principal | ICD-10-CM

## 2021-06-10 LAB — COMPREHENSIVE METABOLIC PANEL
ALBUMIN: 3.8 g/dL (ref 3.4–5.0)
ALKALINE PHOSPHATASE: 52 U/L (ref 46–116)
ALT (SGPT): 8 U/L — ABNORMAL LOW (ref 10–49)
ANION GAP: 5 mmol/L (ref 3–11)
AST (SGOT): 18 U/L (ref <34)
BILIRUBIN TOTAL: 0.2 mg/dL — ABNORMAL LOW (ref 0.3–1.2)
BLOOD UREA NITROGEN: 7 mg/dL — ABNORMAL LOW (ref 9–23)
BUN / CREAT RATIO: 8
CALCIUM: 9.4 mg/dL (ref 8.7–10.4)
CHLORIDE: 106 mmol/L (ref 98–107)
CO2: 28 mmol/L (ref 20.0–31.0)
CREATININE: 0.89 mg/dL (ref 0.60–1.10)
EGFR CKD-EPI (2021) MALE: >90 mL/min/{1.73_m2} (ref >=60)
GLUCOSE RANDOM: 79 mg/dL (ref 70–179)
POTASSIUM: 4.3 mmol/L (ref 3.5–5.1)
PROTEIN TOTAL: 7.6 g/dL (ref 5.7–8.2)
SODIUM: 139 mmol/L (ref 136–145)

## 2021-06-10 LAB — CBC W/ AUTO DIFF
BASOPHILS ABSOLUTE COUNT: 0 10*9/L (ref 0.0–0.1)
BASOPHILS RELATIVE PERCENT: 0.8 %
EOSINOPHILS ABSOLUTE COUNT: 0.2 10*9/L (ref 0.0–0.7)
EOSINOPHILS RELATIVE PERCENT: 4.2 %
HEMATOCRIT: 39.1 % (ref 38.0–50.0)
HEMOGLOBIN: 13.5 g/dL (ref 13.5–17.5)
LYMPHOCYTES ABSOLUTE COUNT: 2 10*9/L (ref 0.7–4.0)
LYMPHOCYTES RELATIVE PERCENT: 37.6 %
MEAN CORPUSCULAR HEMOGLOBIN CONC: 34.4 g/dL (ref 30.0–36.0)
MEAN CORPUSCULAR HEMOGLOBIN: 29.6 pg (ref 26.0–34.0)
MEAN CORPUSCULAR VOLUME: 86 fL (ref 81.0–95.0)
MEAN PLATELET VOLUME: 8.6 fL (ref 7.0–10.0)
MONOCYTES ABSOLUTE COUNT: 0.6 10*9/L (ref 0.1–1.0)
MONOCYTES RELATIVE PERCENT: 10.8 %
NEUTROPHILS ABSOLUTE COUNT: 2.5 10*9/L (ref 1.7–7.7)
NEUTROPHILS RELATIVE PERCENT: 46.6 %
PLATELET COUNT: 307 10*9/L (ref 150–450)
RED BLOOD CELL COUNT: 4.54 10*12/L (ref 4.32–5.72)
RED CELL DISTRIBUTION WIDTH: 14.7 % (ref 12.0–15.0)
WBC ADJUSTED: 5.4 10*9/L (ref 3.5–10.5)

## 2021-06-10 MED ORDER — IMIQUIMOD 5 % TOPICAL CREAM PACKET
PACK | TOPICAL | 2 refills | 28.00000 days | Status: CP
Start: 2021-06-10 — End: 2021-06-10

## 2021-06-10 NOTE — Unmapped (Signed)
Assessment/Plan:      Diagnosis ICD-10-CM Associated Orders   1. Human immunodeficiency virus (HIV) disease (CMS-HCC)  B20 CBC w/ Differential     Metabolic panel, Comp (Chem10 + LFTs)     HIV RNA, Quantitative, PCR     Lymphocyte Markers Limited     imiquimod (ALDARA) 5 % cream   2. HIV antibody positive (CMS-HCC)  Z21 CBC w/ Differential     Metabolic panel, Comp (Chem10 + LFTs)     HIV RNA, Quantitative, PCR     Lymphocyte Markers Limited     imiquimod (ALDARA) 5 % cream   3. Encounter for HIV counseling  Z71.7 CBC w/ Differential     Metabolic panel, Comp (Chem10 + LFTs)     HIV RNA, Quantitative, PCR     Lymphocyte Markers Limited     imiquimod (ALDARA) 5 % cream   4. On highly active antiretroviral therapy (HAART)  Z79.899 CBC w/ Differential     Metabolic panel, Comp (Chem10 + LFTs)     HIV RNA, Quantitative, PCR     Lymphocyte Markers Limited     imiquimod (ALDARA) 5 % cream   5. High risk homosexual behavior  Z72.52 CBC w/ Differential     Metabolic panel, Comp (Chem10 + LFTs)     HIV RNA, Quantitative, PCR     Lymphocyte Markers Limited     imiquimod (ALDARA) 5 % cream   6. Rash and nonspecific skin eruption  R21 CBC w/ Differential     Metabolic panel, Comp (Chem10 + LFTs)     HIV RNA, Quantitative, PCR     Lymphocyte Markers Limited     imiquimod (ALDARA) 5 % cream   7. Acne, unspecified acne type  L70.9 CBC w/ Differential     Metabolic panel, Comp (Chem10 + LFTs)     HIV RNA, Quantitative, PCR     Lymphocyte Markers Limited     imiquimod (ALDARA) 5 % cream   8. Eye inflammation  H57.89 CBC w/ Differential     Metabolic panel, Comp (Chem10 + LFTs)     HIV RNA, Quantitative, PCR     Lymphocyte Markers Limited     imiquimod (ALDARA) 5 % cream   9. Condyloma acuminatum of penis  A63.0 CBC w/ Differential     Metabolic panel, Comp (Chem10 + LFTs)     HIV RNA, Quantitative, PCR     Lymphocyte Markers Limited     imiquimod (ALDARA) 5 % cream       Return in about 4 months (around 10/22/2021).    HPI: Alec Hines is a 20 y.o. year old Black or [place] American male who presents today for HIV follow-up.  Patient recently tested positive for HIV on 02/22/20. ??He was taking Truvada PrEP and went for follow up testing and HIV antibody returned positive. ??Patient has notified sexual contacts. ??Has been reported by [facility]. Patient was infected through homosexual contact about??12??months ago. Discussed HIV biology, medication compliance, resistance, side effects, safe sex with condoms, follow up appointments. Recently tested positive for chlamydia, treated. ??Patient chose to start HAART Biktarvy. ??Last HIV RNA??not detected CD4% 27 Absolute CD4??513.????RPR 1:8 CMV DNA not detected.  Has been taking medication??with no missed doses. 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 ??Has some darkened skin on his nose. ??He will see Dermatology for evaluation. ??Started??him on topical remedies for acne/rash clobetisol for facial acne, improving.????He  has seen Optmetrist for left eye pain and inflammation. No longer using eyedrops.  Vision is improving. ??Follow up with Optometry this month.  He has some condyloma on anterior surface of penis, present for many years.  He will check to see if he has received Gardisil vaccines x 2.  Will order aldara cream for topical application.  Will check additional labs for CMV, RPR. ??Will collect??HIV??labs today. 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  Follow up in??4??months.?? ??????    Past Medical/Surgical History:     Past Medical History:   Diagnosis Date   ??? Chlamydia    ??? HIV disease (CMS-HCC)      No past surgical history on file.    Social History:     Social History     Socioeconomic History   ??? Marital status: Single     Spouse name: None   ??? Number of children: None   ??? Years of education: None   ??? Highest education level: None   Tobacco Use   ??? Smoking status: Former Smoker     Types: e-Cigarettes   ??? Smokeless tobacco: Never Used   Vaping Use   ??? Vaping Use: Former   ??? Quit date: 12/04/2020   ??? Substances: Nicotine, Flavoring   Substance and Sexual Activity   ??? Alcohol use: Yes     Comment: every now and then   ??? Drug use: Yes     Types: Marijuana       Family History:     Family History   Problem Relation Age of Onset   ??? No Known Problems Mother    ??? No Known Problems Father    ??? No Known Problems Brother        Allergies:     Patient has no known allergies.    Current Medications:     Current Outpatient Medications   Medication Sig Dispense Refill   ??? atropine 1 % ophthalmic solution PLACE 1 DROP INTO THE LEFT EYE 2 TIMES DAILY.     ??? bictegrav-emtricit-tenofov ala (BIKTARVY) 50-200-25 mg tablet Take 1 tablet by mouth daily. 30 tablet 11   ??? chlorhexidine (HIBICLENS) 4 % external liquid Apply to affected areas once a week.  Allow to dry 1 hour then shower off. 118 mL 0   ??? clindamycin (CLEOCIN T) 1 % lotion Apply topically Two (2) times a day. 60 mL 2   ??? imiquimod (ALDARA) 5 % cream Apply 1 application topically 3 (three) times a week. 12 packet 2     No current facility-administered medications for this visit.       ROS:     Constitutional: Negative for fever, chills, appetite change, fatigue and unexpected weight change.   HEENT:  Head: Negative for headache, head injury, dizziness, lightheadedness.  Eyes: Glasses or contact lenses.  Negative for pain, redness, excessive tearing, double or blurred vision, spots, specks, flashing lights, glaucoma, cataracts, photophobia.  Ears:  Negative for hearing change, tinnitus, vertigo, earache, infection, discharge.  Nose and sinuses: Negative for frequent colds, nasal stuffiness, discharge or itching, hay fever, nosebleeds.  Throat: Negative for dental abscess, bleeding gums, dentures, sore tongue, dry mouth, frequent sore throats, hoarseness.    Neck:  Negative for lumps, swollen glands, goiter, pain, stiffness  Respiratory: Negative for cough, sputum, hemoptysis, dyspnea, wheezing, pleurisy, last chest x-ray.  Negative for asthma, bronchitis, emphysema, pneumonia, tuberculosis.??   Cardiovascular: Negative for chest pain, palpitations, hypertension, rheumatic fever, heart murmurs,  dyspnea, orthopnea, paroxysmal nocturnal dyspnea, edema, and leg swelling.   Gastrointestinal: Negative for dysphagia, nausea, vomiting, abdominal pain, diarrhea, constipation, blood in stool, abdominal distention, heartburn, appetite change, food intolerance, and anal bleeding.   Endocrine: Negative for cold intolerance, heat intolerance, polydipsia, polyphagia and polyuria.   Genitourinary: Negative for dysuria, urgency, frequency, flank pain, difficulty urinating, hematuria, hesitancy, dribbling, difficulty starting/stopping stream, and genital sores.   Musculoskeletal: Negative for myalgias, back pain, joint swelling, arthralgias, gait problem and neck stiffness. ??  Skin: Negative for color change, pallor, rash, itching, and wound.   Allergic/Immunologic: Negative for environmental allergies and food allergies.   Neurological: Negative for dizziness, tremors, seizures, syncope, speech difficulty, weakness, light-headedness, numbness and headaches.  Negative for changes in mood, attention, orientation, or speech.   Hematological: Negative for adenopathy. Does not bruise/bleed easily.  Negative transfusion reactions.   Psychiatric/Behavioral: Negative for suicidal ideas, hallucinations, behavioral problems, confusion, sleep disturbance, self-injury, dyphoric mood and agitation. The patient is not nervous/anxious.??     Vital Signs:     Vitals:    06/10/21 1017   BP: 104/64   Pulse: 78   Resp: 12   Temp: 36.5 ??C (97.7 ??F)   SpO2: 97%     BP Readings from Last 3 Encounters:   06/10/21 104/64   03/10/21 107/71   12/11/20 102/65     Wt Readings from Last 3 Encounters:   06/10/21 69.9 kg (154 lb)   03/10/21 75.8 kg (167 lb)   12/11/20 70.8 kg (156 lb) (51 %, Z= 0.02)*     * Growth percentiles are based on CDC (Boys, 2-20 Years) data.       Immunizations:     Immunization History   Administered Date(s) Administered   ??? COVID-19 VAC,MRNA,TRIS(12Y UP)(PFIZER)(IM)(PF) 04/21/2021       Labs:     Platelet   Date Value Ref Range Status   03/10/2021 286 150 - 450 10*9/L Final     HGB   Date Value Ref Range Status   03/10/2021 14.4 13.5 - 17.5 g/dL Final     WBC   Date Value Ref Range Status   03/10/2021 5.9 3.5 - 10.5 10*9/L Final     BUN   Date Value Ref Range Status   03/10/2021 9 9 - 23 mg/dL Final     Creatinine   Date Value Ref Range Status   03/10/2021 1.00 0.60 - 1.10 mg/dL Final     ALT   Date Value Ref Range Status   03/10/2021 17 10 - 49 U/L Final     AST   Date Value Ref Range Status   03/10/2021 20 <34 U/L Final     Potassium   Date Value Ref Range Status   03/10/2021 4.4 3.5 - 5.1 mmol/L Final     CO2   Date Value Ref Range Status   03/10/2021 28.0 20.0 - 31.0 mmol/L Final     HIV RNA   Date Value Ref Range Status   10/16/2020 32,305 (H) <0 copies/mL Final     Hepatitis C Ab   Date Value Ref Range Status   03/05/2020 Nonreactive Nonreactive Final     Gonorrhoeae NAA   Date Value Ref Range Status   03/05/2020 Negative Negative Final     Chlamydia trachomatis, NAA   Date Value Ref Range Status   03/05/2020 Negative Negative Final     CD4% (T Helper)   Date Value Ref Range Status   03/10/2021 27 (L) 34 -  58 % Final         Physical Exam:     General: Well-appearing, calm, well-groomed, well-nourished.  No distress  Eyes: PERRL. Extraocular muscles intact, sclera clear   ENT: Nares without discharge, oropharynx without lesions or exudate.   Neck: Supple, no thyromegaly, bruit, or JVD.   Lymph Nodes: No adenopathy (cervical, axillary)   Cardiovascular: RRR, S1 and S2 normal. No murmur, rub, or gallop.   Lungs: Clear to auscultation bilaterally without wheezes/crackles/rhonchi. Good air movement.   Skin: No rashes or lesions noted on limited exam. Warm and dry with no erythema or pallor.  Abdomen: Normoactive bowel sounds, abdomen soft, nontender, and nondistended, no hepatosplenomegaly or masses. Liver normal in size, no rebound or guarding.   Genitourinary: Deferred   Extremities: No cyanosis, clubbing or edema. No joint effusions, FROM without tenderness.   Musculoskeletal: No spine or costovertebral angle tenderness.   Neuro: Alert and oriented to person, place, and time. Cranial nerves II-XII grossly intact, normal gait, normal sensation throughout, normal cerebellar function.  Psychiatry: Alert and oriented to person, place, and time, conversant, appropriate affect.   Nursing notes and vitals reviewed

## 2021-06-11 LAB — LYMPH MARKER LIMITED,FLOW
ABSOLUTE CD3 CNT: 1440 {cells}/uL (ref 915–3400)
ABSOLUTE CD4 CNT: 780 {cells}/uL (ref 510–2320)
ABSOLUTE CD8 CNT: 620 {cells}/uL (ref 180–1520)
CD3% (T CELLS): 72 % (ref 61–86)
CD4% (T HELPER): 39 % (ref 34–58)
CD4:CD8 RATIO: 1.3 (ref 0.9–4.8)
CD8% T SUPPRESR: 31 % (ref 12–38)

## 2021-06-11 LAB — SYPHILIS SCREEN: SYPHILIS AB IGG/IGM SCREEN: POSITIVE — AB

## 2021-06-12 LAB — HIV RNA, QUANTITATIVE, PCR
HIV RNA LOG(10): 1.78 {Log_copies}/mL — ABNORMAL HIGH (ref <0.00)
HIV RNA QNT RSLT: DETECTED — AB
HIV RNA: 60 {copies}/mL — ABNORMAL HIGH (ref <0)

## 2021-06-12 LAB — RPR, REVERSE ALGORITHM REFLEX: SYPHILIS RPR SCREEN: REACTIVE — AB

## 2021-06-13 LAB — CMV DNA, QUANTITATIVE, PCR: CMV VIRAL LD: NOT DETECTED

## 2021-06-18 NOTE — Unmapped (Signed)
[facility] Refill Coordination Note    Specialty Medication(s) to be Shipped:   Infectious Disease: Biktarvy    Other medication(s) to be shipped: No additional medications requested for fill at this time     Alec Hines, DOB: 03-29-2001  Phone: [phone] (home)       All above HIPAA information was verified with patient.     Was a Nurse, learning disability used for this call? No    Completed refill call assessment today to schedule patient's medication shipment from the [facility] [phone]).  All relevant notes have been reviewed.     Specialty medication(s) and dose(s) confirmed: Regimen is correct and unchanged.   Changes to medications: Alec Hines reports no changes at this time.  Changes to insurance: No  New side effects reported not previously addressed with a pharmacist or physician: None reported  Questions for the pharmacist: No    Confirmed patient received a Conservation officer, historic buildings and a Surveyor, mining with first shipment. The patient will receive a drug information handout for each medication shipped and additional FDA Medication Guides as required.       DISEASE/MEDICATION-SPECIFIC INFORMATION        N/A    SPECIALTY MEDICATION ADHERENCE     Medication Adherence    Patient reported X missed doses in the last month: 0  Specialty Medication: biktarvy              Were doses missed due to medication being on hold? No    Unable to confirm quantity on hand    REFERRAL TO PHARMACIST     Referral to the pharmacist: Not needed      [facility]     Shipping address confirmed in Epic.     Delivery Scheduled: Yes, Expected medication delivery date: 8/25.     Medication will be delivered via UPS to the prescription address in Epic WAM.    Alec Hines   [facility] Specialty Technician

## 2021-06-24 DIAGNOSIS — Z717 Human immunodeficiency virus [HIV] counseling: Principal | ICD-10-CM

## 2021-06-24 DIAGNOSIS — B20 Human immunodeficiency virus [HIV] disease: Principal | ICD-10-CM

## 2021-06-24 DIAGNOSIS — Z21 Asymptomatic human immunodeficiency virus [HIV] infection status: Principal | ICD-10-CM

## 2021-06-24 NOTE — Unmapped (Signed)
Alec Hines 's Biktarvy shipment will be delayed as a result of MAPs seeking manufacture assistance.     I have reached out to the patient  at [phone] and communicated the delay. We will call the patient back to reschedule the delivery upon resolution. We have not confirmed the new delivery date.

## 2021-06-25 ENCOUNTER — Emergency Department (HOSPITAL_COMMUNITY)
Admission: EM | Admit: 2021-06-25 | Discharge: 2021-06-25 | Disposition: A | Discharge status: Home / Self Care | Payer: BC Managed Care – PPO | Attending: Emergency Medicine | Admitting: Emergency Medicine

## 2021-06-25 DIAGNOSIS — F17290 Nicotine dependence, other tobacco product, uncomplicated: Secondary | ICD-10-CM | POA: Diagnosis not present

## 2021-06-25 DIAGNOSIS — J020 Streptococcal pharyngitis: Secondary | ICD-10-CM | POA: Diagnosis not present

## 2021-06-25 DIAGNOSIS — J029 Acute pharyngitis, unspecified: Secondary | ICD-10-CM | POA: Diagnosis present

## 2021-06-25 LAB — MONONUCLEOSIS SCREEN: Mono Screen: NEGATIVE

## 2021-06-25 LAB — GROUP A STREP BY PCR: Group A Strep by PCR: DETECTED — AB

## 2021-06-25 MED ORDER — AMOXICILLIN 500 MG PO CAPS
500.0000 mg | ORAL_CAPSULE | Freq: Two times a day (BID) | ORAL | 0 refills | Status: DC
Start: 2021-06-25 — End: 2021-06-28

## 2021-06-25 MED ORDER — DEXAMETHASONE 4 MG PO TABS
10.0000 mg | ORAL_TABLET | Freq: Once | ORAL | Status: AC
  Administered 2021-06-25: 10 mg via ORAL
  Filled 2021-06-25: qty 3

## 2021-06-25 MED ORDER — ACETAMINOPHEN 500 MG PO TABS
1000.0000 mg | ORAL_TABLET | Freq: Once | ORAL | Status: AC
  Administered 2021-06-25: 1000 mg via ORAL
  Filled 2021-06-25: qty 2

## 2021-06-25 MED ORDER — AMOXICILLIN 500 MG PO CAPS
500.0000 mg | ORAL_CAPSULE | Freq: Once | ORAL | Status: AC
  Administered 2021-06-25: 500 mg via ORAL
  Filled 2021-06-25: qty 1

## 2021-06-25 NOTE — ED Provider Notes (Signed)
Emergency Medicine Provider Triage Evaluation Note  Alan Joseph , a 20 y.o. male  was evaluated in triage.  Pt complains of sore throat.  Review of Systems  Positive: Sore throat, fever, neck pain Negative: N/v/d, cp, sob, abd pain  Physical Exam  BP 115/76   Pulse 93   Temp (!) 101.1 F (38.4 C) (Oral)   Resp 14   SpO2 100%  Gen:   Awake, no distress   Resp:  Normal effort  MSK:   Moves extremities without difficulty  Other:  Throat: uvula midline bilateral tonsillar enlargement with post oropharyngeal erythema.  No trismus.  Cervical lymphadenopathy  Medical Decision Making  Medically screening exam initiated at 6:15 PM.  Appropriate orders placed.  Alan Joseph was informed that the remainder of the evaluation will be completed by another provider, this initial triage assessment does not replace that evaluation, and the importance of remaining in the ED until their evaluation is complete.  Pt is HIV positive here with sore throat x 5 days.  Does have bilateral tonsillar enlargement with exudates.  No airway compromise.     Fayrene Helper, PA-C 06/25/21 1817    Koleen Distance, MD 06/25/21 [phone]

## 2021-06-25 NOTE — ED Provider Notes (Signed)
[facility] EMERGENCY DEPARTMENT Provider Note   CSN: [id number] Arrival date & time: 06/25/21  1751     History Chief Complaint  Patient presents with   Sore Throat   Fever    Alan Joseph is a 20 y.o. male.   Sore Throat Pertinent negatives include no chest pain, no abdominal pain and no shortness of breath.  Fever Associated symptoms: sore throat   Associated symptoms: no chest pain, no chills, no cough, no dysuria, no ear pain, no rash and no vomiting    20 year old male with up-to-date on childhood vaccinations presenting to the emergency department with 4 to 5 days of fever and sore throat.  Patient states that is painful to swallow.  His symptoms are constant, progressively worsening.  He he has been using ibuprofen and Tylenol at home with minimal improvement in his symptoms.  He denies any cough.  He denies any headache.  He denies any nausea, vomiting, diarrhea.  He denies any chest pain.  Patient is up-to-date on his childhood vaccinations.  He states that his voice seems more hoarse to him.  No past medical history on file.  There are no problems to display for this patient.   No past surgical history on file.     No family history on file.  Social History   Tobacco Use   Smoking status: Every Day    Types: E-cigarettes  Substance Use Topics   Alcohol use: Yes   Drug use: Not Currently    Home Medications Prior to Admission medications   Medication Sig Start Date End Date Taking? Authorizing Provider  amoxicillin (AMOXIL) 500 MG capsule Take 1 capsule (500 mg total) by mouth 2 (two) times daily. 06/25/21  Yes Lenard Lance, MD    Allergies    Patient has no known allergies.  Review of Systems   Review of Systems  Constitutional:  Positive for fever. Negative for chills.  HENT:  Positive for sore throat. Negative for ear pain.   Eyes:  Negative for pain and visual disturbance.  Respiratory:  Negative for cough and  shortness of breath.   Cardiovascular:  Negative for chest pain and palpitations.  Gastrointestinal:  Negative for abdominal pain and vomiting.  Genitourinary:  Negative for dysuria and hematuria.  Musculoskeletal:  Negative for arthralgias and back pain.  Skin:  Negative for color change and rash.  Neurological:  Negative for seizures and syncope.  All other systems reviewed and are negative.  Physical Exam Updated Vital Signs BP 115/76   Pulse 93   Temp (!) 101.1 F (38.4 C) (Oral)   Resp 14   SpO2 100%   Physical Exam Vitals and nursing note reviewed.  Constitutional:      Appearance: He is well-developed.  HENT:     Head: Normocephalic and atraumatic.     Right Ear: No drainage or swelling.     Left Ear: No drainage or swelling.     Mouth/Throat:     Pharynx: Posterior oropharyngeal erythema present. No pharyngeal swelling or oropharyngeal exudate.     Tonsils: Tonsillar exudate present. 3+ on the right. 3+ on the left.  Eyes:     Conjunctiva/sclera: Conjunctivae normal.  Cardiovascular:     Rate and Rhythm: Normal rate and regular rhythm.     Heart sounds: No murmur heard. Pulmonary:     Effort: Pulmonary effort is normal. No respiratory distress.     Breath sounds: Normal breath sounds.  Abdominal:  Palpations: Abdomen is soft.     Tenderness: There is no abdominal tenderness.  Musculoskeletal:     Cervical back: Neck supple.  Lymphadenopathy:     Cervical: Cervical adenopathy (tender, anterior) present.  Skin:    General: Skin is warm and dry.  Neurological:     Mental Status: He is alert.    ED Results / Procedures / Treatments   Labs (all labs ordered are listed, but only abnormal results are displayed) Labs Reviewed  GROUP A STREP BY PCR - Abnormal; Notable for the following components:      Result Value   Group A Strep by PCR DETECTED (*)    All other components within normal limits  MONONUCLEOSIS SCREEN  GC/CHLAMYDIA PROBE AMP ([place])  NOT AT [facility]    EKG None  Radiology No results found.  Procedures Procedures   Medications Ordered in ED Medications  acetaminophen (TYLENOL) tablet 1,000 mg (1,000 mg Oral Given 06/25/21 1825)  amoxicillin (AMOXIL) capsule 500 mg (500 mg Oral Given 06/25/21 1937)  dexamethasone (DECADRON) tablet 10 mg (10 mg Oral Given 06/25/21 1937)    ED Course  I have reviewed the triage vital signs and the nursing notes.  Pertinent labs & imaging results that were available during my care of the patient were reviewed by me and considered in my medical decision making (see chart for details).    MDM Rules/Calculators/A&P                           20 year old male presenting with 4 days of sore throat and fever.  Vital signs reviewed, the patient is febrile here.  Given Tylenol for antipyresis here in triage.  On physical exam, the patient has slight dysphonia.  He has significant bilateral tonsillar edema as well as exudates.  He has anterior cervical lymphadenopathy.  He has 4-4 Centor criteria.  His presentation is most consistent with bacterial pharyngitis.  The patient ranges his neck easily and has no asymmetric swelling, I have no concern for RPA, PTA, bacterial tracheitis.  He is nontoxic-appearing.  He appears clinically well-hydrated.  He is tolerating p.o.  Will administer dexamethasone and his first dose of amoxicillin here.  Will prescribe antibiotics as an outpatient to the preferred pharmacy.  I believe that he is stable for discharge home with this appropriate treatment.  Patient is comfortable with the plan.  Strict return precautions discussed. Final Clinical Impression(s) / ED Diagnoses Final diagnoses:  Strep pharyngitis    Rx / DC Orders ED Discharge Orders          Ordered    amoxicillin (AMOXIL) 500 MG capsule  2 times daily        06/25/21 1926             Lenard Lance, MD 06/25/21 1940    Cathren Laine, MD 06/25/21 [phone]

## 2021-06-25 NOTE — Discharge Instructions (Addendum)
You have strep throat based on exam and your test returned positive.  Please take this antibiotic for the next week.  We also give you a steroid here in the emergency department that will help with swelling over the next several days.  I would continue to take Tylenol and ibuprofen for pain control at home.  Please follow-up with your primary care doctor.

## 2021-06-25 NOTE — ED Triage Notes (Signed)
Pt c/o sore throat, fever since Monday, highest fever 101 at home; sore throat worsening throughout the week. States he feels it's becoming harder to talk, swallow, breathe normally. States OTC medication has helped, last dose of ibuprofen approx 12pm, states it "eases it up a little."

## 2021-06-26 LAB — GC/CHLAMYDIA PROBE AMP (~~LOC~~) NOT AT ARMC
Chlamydia: NEGATIVE
Comment: NEGATIVE
Comment: NORMAL
Neisseria Gonorrhea: NEGATIVE

## 2021-06-28 ENCOUNTER — Emergency Department (HOSPITAL_COMMUNITY)
Admission: EM | Admit: 2021-06-28 | Discharge: 2021-06-28 | Disposition: A | Discharge status: Home / Self Care | Payer: BC Managed Care – PPO | Attending: Emergency Medicine | Admitting: Emergency Medicine

## 2021-06-28 ENCOUNTER — Encounter (HOSPITAL_COMMUNITY): Payer: Self-pay | Admitting: Emergency Medicine

## 2021-06-28 ENCOUNTER — Other Ambulatory Visit: Payer: Self-pay

## 2021-06-28 ENCOUNTER — Emergency Department (HOSPITAL_COMMUNITY): Payer: BC Managed Care – PPO

## 2021-06-28 DIAGNOSIS — F17290 Nicotine dependence, other tobacco product, uncomplicated: Secondary | ICD-10-CM | POA: Diagnosis not present

## 2021-06-28 DIAGNOSIS — J029 Acute pharyngitis, unspecified: Secondary | ICD-10-CM | POA: Diagnosis present

## 2021-06-28 DIAGNOSIS — J039 Acute tonsillitis, unspecified: Secondary | ICD-10-CM

## 2021-06-28 DIAGNOSIS — J020 Streptococcal pharyngitis: Secondary | ICD-10-CM | POA: Diagnosis not present

## 2021-06-28 LAB — CBC WITH DIFFERENTIAL/PLATELET
Abs Immature Granulocytes: 0.07 10*3/uL (ref 0.00–0.07)
Basophils Absolute: 0.1 10*3/uL (ref 0.0–0.1)
Basophils Relative: 1 %
Eosinophils Absolute: 0.2 10*3/uL (ref 0.0–0.5)
Eosinophils Relative: 3 %
HCT: 36.9 % — ABNORMAL LOW (ref 39.0–52.0)
Hemoglobin: 12.2 g/dL — ABNORMAL LOW (ref 13.0–17.0)
Immature Granulocytes: 1 %
Lymphocytes Relative: 46 %
Lymphs Abs: 4 10*3/uL (ref 0.7–4.0)
MCH: 28.8 pg (ref 26.0–34.0)
MCHC: 33.1 g/dL (ref 30.0–36.0)
MCV: 87 fL (ref 80.0–100.0)
Monocytes Absolute: 0.7 10*3/uL (ref 0.1–1.0)
Monocytes Relative: 9 %
Neutro Abs: 3.4 10*3/uL (ref 1.7–7.7)
Neutrophils Relative %: 40 %
Platelets: 379 10*3/uL (ref 150–400)
RBC: 4.24 MIL/uL (ref 4.22–5.81)
RDW: 13.8 % (ref 11.5–15.5)
WBC: 8.5 10*3/uL (ref 4.0–10.5)
nRBC: 0 % (ref 0.0–0.2)

## 2021-06-28 LAB — BASIC METABOLIC PANEL
Anion gap: 7 (ref 5–15)
BUN: 5 mg/dL — ABNORMAL LOW (ref 6–20)
CO2: 25 mmol/L (ref 22–32)
Calcium: 8.7 mg/dL — ABNORMAL LOW (ref 8.9–10.3)
Chloride: 104 mmol/L (ref 98–111)
Creatinine, Ser: 0.82 mg/dL (ref 0.61–1.24)
GFR, Estimated: >60 mL/min (ref >60)
Glucose, Bld: 89 mg/dL (ref 70–99)
Potassium: 3.9 mmol/L (ref 3.5–5.1)
Sodium: 136 mmol/L (ref 135–145)

## 2021-06-28 MED ORDER — IBUPROFEN 800 MG PO TABS: 800.0000 mg | ORAL_TABLET | Freq: Three times a day (TID) | ORAL | 0 refills | Status: AC | PRN

## 2021-06-28 MED ORDER — DEXAMETHASONE SODIUM PHOSPHATE 10 MG/ML IJ SOLN
10.0000 mg | Freq: Once | INTRAMUSCULAR | Status: AC
  Administered 2021-06-28: 10 mg via INTRAVENOUS
  Filled 2021-06-28: qty 1

## 2021-06-28 MED ORDER — AMOXICILLIN-POT CLAVULANATE 875-125 MG PO TABS: 1.0000 | ORAL_TABLET | Freq: Two times a day (BID) | ORAL | 0 refills | Status: AC

## 2021-06-28 MED ORDER — KETOROLAC TROMETHAMINE 30 MG/ML IJ SOLN
30.0000 mg | Freq: Once | INTRAMUSCULAR | Status: AC
  Administered 2021-06-28: 30 mg via INTRAVENOUS
  Filled 2021-06-28: qty 1

## 2021-06-28 MED ORDER — IOHEXOL 350 MG/ML SOLN
50.0000 mL | Freq: Once | INTRAVENOUS | Status: AC | PRN
  Administered 2021-06-28: 50 mL via INTRAVENOUS

## 2021-06-28 MED ORDER — SODIUM CHLORIDE 0.9 % IV SOLN
3.0000 g | Freq: Once | INTRAVENOUS | Status: AC
  Administered 2021-06-28: 3 g via INTRAVENOUS
  Filled 2021-06-28: qty 8

## 2021-06-28 NOTE — ED Provider Notes (Addendum)
[facility] EMERGENCY DEPARTMENT Provider Note   CSN: [id number] Arrival date & time: 06/28/21  8413     History Chief Complaint  Patient presents with   Sore Throat    Alan Joseph is a 20 y.o. male with history of HIV reports compliance with medications presenting to the ED with sore throat.  The patient was seen in the ER 3 days ago on August 25 and diagnosed with strep throat by testing and started on amoxicillin.  He has been taking this medication for 2 days, feels that his symptoms are not getting any better.  He continues have a muffled voice, sore throat, difficulty swallowing.  He is able to swallow pills and water.  He has also been taking ibuprofen.  He denies any further fevers or chills.  He reports a mild headache.  He did have a negative monotest on the 25th.  Denies prior history of strep throat or tonsillitis in the past.  He also did a negative GC chlamydia test at the time  Per medical records review, the patient had an absolute CD4 count of 513 on May 2022.  HPI     History reviewed. No pertinent past medical history.  There are no problems to display for this patient.   History reviewed. No pertinent surgical history.     No family history on file.  Social History   Tobacco Use   Smoking status: Every Day    Types: E-cigarettes  Substance Use Topics   Alcohol use: Yes   Drug use: Not Currently    Home Medications Prior to Admission medications   Medication Sig Start Date End Date Taking? Authorizing Provider  amoxicillin-clavulanate (AUGMENTIN) 875-125 MG tablet Take 1 tablet by mouth 2 (two) times daily for 10 days. 06/28/21 07/08/21 Yes Devin Foskey, Kermit Balo, MD  ibuprofen (ADVIL) 800 MG tablet Take 1 tablet (800 mg total) by mouth every 8 (eight) hours as needed for up to 30 doses. 06/28/21  Yes Josephus Harriger, Kermit Balo, MD    Allergies    Patient has no known allergies.  Review of Systems   Review of Systems   Constitutional:  Negative for chills and fever.  HENT:  Positive for sore throat, trouble swallowing and voice change. Negative for ear pain.   Eyes:  Negative for pain and visual disturbance.  Respiratory:  Negative for cough and shortness of breath.   Cardiovascular:  Negative for chest pain and palpitations.  Gastrointestinal:  Negative for abdominal pain and vomiting.  Genitourinary:  Negative for dysuria and hematuria.  Musculoskeletal:  Negative for arthralgias and back pain.  Skin:  Negative for color change and rash.  Neurological:  Negative for syncope and light-headedness.  All other systems reviewed and are negative.  Physical Exam Updated Vital Signs BP 106/66 (BP Location: Right Arm)   Pulse (!) 58   Temp 98 F (36.7 C) (Oral)   Resp 16   SpO2 100%   Physical Exam Constitutional:      General: He is not in acute distress. HENT:     Head: Normocephalic and atraumatic.     Jaw: There is normal jaw occlusion.     Mouth/Throat:     Lips: Pink.     Mouth: Mucous membranes are moist.     Dentition: Normal dentition.     Tongue: No lesions.     Pharynx: Pharyngeal swelling, oropharyngeal exudate, posterior oropharyngeal erythema and uvula swelling present.     Tonsils: 3+ on the  right. 3+ on the left.  Eyes:     Conjunctiva/sclera: Conjunctivae normal.     Pupils: Pupils are equal, round, and reactive to light.  Cardiovascular:     Rate and Rhythm: Normal rate and regular rhythm.  Pulmonary:     Effort: Pulmonary effort is normal. No respiratory distress.  Abdominal:     General: There is no distension.     Tenderness: There is no abdominal tenderness.  Skin:    General: Skin is warm and dry.  Neurological:     General: No focal deficit present.     Mental Status: He is alert. Mental status is at baseline.  Psychiatric:        Mood and Affect: Mood normal.        Behavior: Behavior normal.    ED Results / Procedures / Treatments   Labs (all labs ordered  are listed, but only abnormal results are displayed) Labs Reviewed  BASIC METABOLIC PANEL - Abnormal; Notable for the following components:      Result Value   BUN 5 (*)    Calcium 8.7 (*)    All other components within normal limits  CBC WITH DIFFERENTIAL/PLATELET - Abnormal; Notable for the following components:   Hemoglobin 12.2 (*)    HCT 36.9 (*)    All other components within normal limits    EKG None  Radiology CT Soft Tissue Neck W Contrast  Result Date: 06/28/2021 CLINICAL DATA:  Epiglottitis or tonsillitis suspected. Significant bilateral swelling of the peritonsillar space, evaluate for PTA. Sore throat with difficulty swallowing and bilateral neck swelling. Worsening symptoms over the past week. EXAM: CT NECK WITH CONTRAST TECHNIQUE: Multidetector CT imaging of the neck was performed using the standard protocol following the bolus administration of intravenous contrast. CONTRAST:  28mL OMNIPAQUE IOHEXOL 350 MG/ML SOLN COMPARISON:  None. FINDINGS: Pharynx and larynx: Prominent enlargement of the palatine tonsils bilaterally with striated enhancement narrowing the upper oropharyngeal airway. No epiglottic swelling. No peritonsillar or retropharyngeal fluid collection. Salivary glands: No inflammation, mass, or stone. Thyroid: Unremarkable. Lymph nodes: Enlarged level II lymph nodes measuring up to 1.5 cm in short axis bilaterally. Vascular: Major vascular structures of the neck are grossly patent. Limited intracranial: Unremarkable. Visualized orbits: Unremarkable. Mastoids and visualized paranasal sinuses: Large mucous retention cyst in the right maxillary sinus. Clear mastoid air cells. Skeleton: No acute osseous abnormality or suspicious osseous lesion. Upper chest: No apical lung consolidation or mass. Other: None. IMPRESSION: 1. Acute tonsillitis. No abscess. 2. Bilateral level II lymphadenopathy, likely reactive. Electronically Signed   By: Sebastian Ache M.D.   On: 06/28/2021 17:10     Procedures Procedures   Medications Ordered in ED Medications  dexamethasone (DECADRON) injection 10 mg (10 mg Intravenous Given 06/28/21 1313)  ketorolac (TORADOL) 30 MG/ML injection 30 mg (30 mg Intravenous Given 06/28/21 1312)  iohexol (OMNIPAQUE) 350 MG/ML injection 50 mL (50 mLs Intravenous Contrast Given 06/28/21 1559)  Ampicillin-Sulbactam (UNASYN) 3 g in sodium chloride 0.9 % 100 mL IVPB (0 g Intravenous Stopped 06/28/21 1804)    ED Course  I have reviewed the triage vital signs and the nursing notes.  Pertinent labs & imaging results that were available during my care of the patient were reviewed by me and considered in my medical decision making (see chart for details).  Patient is here with sore throat, differential diagnosis includes tonsillitis or uveitis versus peritonsillar abscess versus persistent strep throat versus other.  He has been compliant with his HIV medications  and had a normal CD4 count 3 months ago when last checked, this is less likely an AIDS-defining condition.  Mono and GC chlamydia test were negative from 3 days ago.  Will need a CT scan of soft tissue of the neck to evaluate for abscess.  Have ordered for some IV Decadron, IV Toradol, and basic lab work.  *  Labs reviewed - CBC normal.  CT imaging reviewed with likely tonsillitis and lymphadenopathy (likely reactive), no PTA or deep space infection reported.  Patient remained afebrile and non-toxic appearing during his 12 hour stay in the ED.  *  Patient given IV decadron, IV toradol, IV unasyn in ED.  Switched amoxicillin to Augmentin for broader coverage.  Advised ENT f/u given his degree of tonsillitis, and carefully discussed return precautions, including inability to swallow, respiratory difficulties, or new or persistent fevers at home.  Overall he felt much better after his medications, and I felt he was clinically stable for discharge with specialist follow up.  Clinical Course as of  06/29/21 0945  Sun Jun 28, 2021  1413 Reviewed the patient's chart and I discussed with him his syphilis diagnosis.  He reports he had a PICC line earlier this year and was treated with 2 weeks of penicillin, told that his infection had resolved. [MT]    Clinical Course User Index [MT] Deliliah Spranger, Kermit Balo, MD    Final Clinical Impression(s) / ED Diagnoses Final diagnoses:  Strep throat  Tonsillitis    Rx / DC Orders ED Discharge Orders          Ordered    amoxicillin-clavulanate (AUGMENTIN) 875-125 MG tablet  2 times daily        06/28/21 1719    ibuprofen (ADVIL) 800 MG tablet  Every 8 hours PRN        06/28/21 1821             Terald Sleeper, MD 06/28/21 1415    Terald Sleeper, MD 06/29/21 [phone]

## 2021-06-28 NOTE — ED Notes (Signed)
Patient transported to CT 

## 2021-06-28 NOTE — ED Triage Notes (Signed)
Patient here with strep throat.  States he was here on 8/25 and was given a prescription for amoxicillen and states that he is not feeling better.  He states that he has not missed any of his doses.

## 2021-06-28 NOTE — ED Notes (Signed)
Called pt x3 for MSE form and vitals, no response.

## 2021-07-08 MED FILL — BIKTARVY 50 MG-200 MG-25 MG TABLET: ORAL | 30 days supply | Qty: 30 | Fill #8

## 2021-07-08 NOTE — Unmapped (Signed)
Alec Hines 's Biktarvy shipment will be sent out  as a result of obtained new insurance for the patient.      I have reached out to the patient  at [phone] and communicated the delivery change. We will reschedule the medication for the delivery date that the patient agreed upon.  We have confirmed the delivery date as 9/8, via ups.

## 2021-07-31 NOTE — Unmapped (Signed)
[facility] Refill Coordination Note    Specialty Medication(s) to be Shipped:   Infectious Disease: Biktarvy    Other medication(s) to be shipped: clindamycin lotion     Alec Hines, DOB: 2001/02/10  Phone: [phone] (home)       All above HIPAA information was verified with patient.     Was a Nurse, learning disability used for this call? No    Completed refill call assessment today to schedule patient's medication shipment from the [facility] [phone]).  All relevant notes have been reviewed.     Specialty medication(s) and dose(s) confirmed: Regimen is correct and unchanged.   Changes to medications: Alec Hines reports no changes at this time.  Changes to insurance: No  New side effects reported not previously addressed with a pharmacist or physician: None reported  Questions for the pharmacist: No    Confirmed patient received a Conservation officer, historic buildings and a Surveyor, mining with first shipment. The patient will receive a drug information handout for each medication shipped and additional FDA Medication Guides as required.       DISEASE/MEDICATION-SPECIFIC INFORMATION        N/A    SPECIALTY MEDICATION ADHERENCE     Medication Adherence    Patient reported X missed doses in the last month: 0  Specialty Medication: biktarvy              Were doses missed due to medication being on hold? No    biktarvy  : 10 days of medicine on hand       REFERRAL TO PHARMACIST     Referral to the pharmacist: Not needed      [facility]     Shipping address confirmed in Epic.     Delivery Scheduled: Yes, Expected medication delivery date: 10/6.     Medication will be delivered via UPS to the temporary address in Epic WAM.    [name]   [facility] Specialty Technician

## 2021-08-05 MED FILL — BIKTARVY 50 MG-200 MG-25 MG TABLET: ORAL | 30 days supply | Qty: 30 | Fill #9

## 2021-08-05 MED FILL — CLINDAMYCIN 1 % LOTION: TOPICAL | 15 days supply | Qty: 60 | Fill #1

## 2021-08-07 ENCOUNTER — Ambulatory Visit: Payer: BC Managed Care – PPO | Attending: Family

## 2021-08-07 DIAGNOSIS — Z23 Encounter for immunization: Secondary | ICD-10-CM

## 2021-08-07 NOTE — Progress Notes (Signed)
   Covid-19 Vaccination Clinic  Name:  Alan Joseph    MRN: [id number] DOB: 2001/06/04  08/07/2021  Alan Joseph was observed post Covid-19 immunization for 15 minutes without incident. He was provided with Vaccine Information Sheet and instruction to access the V-Safe system.   Alan Joseph was instructed to call 911 with any severe reactions post vaccine: Difficulty breathing  Swelling of face and throat  A fast heartbeat  A bad rash all over body  Dizziness and weakness   Immunizations Administered     Name Date Dose VIS Date Route   PFIZER Comrnaty(Gray TOP) Covid-19 Vaccine 08/07/2021  9:30 AM 0.3 mL 07/01/2021 Intramuscular   Manufacturer: [employer], [employer]   Lot: [id number]   NDC: [phone]

## 2021-08-28 NOTE — Unmapped (Signed)
[facility] Refill Coordination Note    Specialty Medication(s) to be Shipped:   Infectious Disease: Biktarvy    Other medication(s) to be shipped: No additional medications requested for fill at this time     Alec Hines, DOB: 01-Feb-2001  Phone: [phone] (home)       All above HIPAA information was verified with patient.     Was a Nurse, learning disability used for this call? No    Completed refill call assessment today to schedule patient's medication shipment from the [facility] [phone]).  All relevant notes have been reviewed.     Specialty medication(s) and dose(s) confirmed: Regimen is correct and unchanged.   Changes to medications: Alec Hines reports no changes at this time.  Changes to insurance: No  New side effects reported not previously addressed with a pharmacist or physician: None reported  Questions for the pharmacist: No    Confirmed patient received a Conservation officer, historic buildings and a Surveyor, mining with first shipment. The patient will receive a drug information handout for each medication shipped and additional FDA Medication Guides as required.       DISEASE/MEDICATION-SPECIFIC INFORMATION        N/A    SPECIALTY MEDICATION ADHERENCE     Medication Adherence    Patient reported X missed doses in the last month: 1  Specialty Medication: BIKTARVY 50-200-25 mg              Were doses missed due to medication being on hold? No    Biktarvy 50-200-25 mg: 6 days of medicine on hand        REFERRAL TO PHARMACIST     Referral to the pharmacist: Not needed      [facility]     Shipping address confirmed in Epic.     Delivery Scheduled: Yes, Expected medication delivery date: 09/02/21.     Medication will be delivered via UPS to the temporary address in Epic WAM.    Alec Hines   [facility] Specialty Technician

## 2021-09-01 MED FILL — BIKTARVY 50 MG-200 MG-25 MG TABLET: ORAL | 30 days supply | Qty: 30 | Fill #10

## 2021-09-23 NOTE — Unmapped (Signed)
[facility] Clinical Assessment & Refill Coordination Note    Alec Hines, [place]: November 09, 2000  Phone: [phone] (home)     All above HIPAA information was verified with patient.     Was a Nurse, learning disability used for this call? No    Specialty Medication(s):   Infectious Disease: Biktarvy     Current Outpatient Medications   Medication Sig Dispense Refill   ??? atropine 1 % ophthalmic solution PLACE 1 DROP INTO THE LEFT EYE 2 TIMES DAILY.     ??? bictegrav-emtricit-tenofov ala (BIKTARVY) 50-200-25 mg tablet Take 1 tablet by mouth daily. 30 tablet 11   ??? chlorhexidine (HIBICLENS) 4 % external liquid Apply to affected areas once a week.  Allow to dry 1 hour then shower off. 118 mL 0   ??? clindamycin (CLEOCIN T) 1 % lotion Apply topically Two (2) times a day. 60 mL 2     No current facility-administered medications for this visit.        Changes to medications: Alec Hines reports no changes at this time.    No Known Allergies    Changes to allergies: No    SPECIALTY MEDICATION ADHERENCE     Biktarvy 50-200-25 mg: 7 to 10 days of medicine on hand       Medication Adherence    Patient reported X missed doses in the last month: 0  Specialty Medication: Biktarvy 50-200-25mg   Patient is on additional specialty medications: No  Any gaps in refill history greater than 2 weeks in the last 3 months: no  Demonstrates understanding of importance of adherence: yes  Informant: patient  Provider-estimated medication adherence level: good  Patient is at risk for Non-Adherence: No          Specialty medication(s) dose(s) confirmed: Regimen is correct and unchanged.     Are there any concerns with adherence? No    Adherence counseling provided? Not needed    CLINICAL MANAGEMENT AND INTERVENTION      Clinical Benefit Assessment:    Do you feel the medicine is effective or helping your condition? Yes    Clinical Benefit counseling provided? Not needed    Adverse Effects Assessment:    Are you experiencing any side effects? No    Are you experiencing difficulty administering your medicine? No    Quality of Life Assessment:    How many days over the past month did your HIV  keep you from your normal activities? For example, brushing your teeth or getting up in the morning. 0    Have you discussed this with your provider? Not needed    Acute Infection Status:    Acute infections noted within Epic:  No active infections  Patient reported infection: None    Therapy Appropriateness:    Is therapy appropriate and patient progressing towards therapeutic goals? Yes, therapy is appropriate and should be continued    DISEASE/MEDICATION-SPECIFIC INFORMATION      N/A    PATIENT SPECIFIC NEEDS     - Does the patient have any physical, cognitive, or cultural barriers? No    - Is the patient high risk? No    - Does the patient require a Care Management Plan? No     - Does the patient require physician intervention or other additional services (i.e. nutrition, smoking cessation, social work)? No      SHIPPING     Specialty Medication(s) to be Shipped:   Infectious Disease: Biktarvy    Other medication(s) to be  shipped: clindamycin 1% lotion     Changes to insurance: No    Delivery Scheduled: Yes, Expected medication delivery date: 09/30/21.     Medication will be delivered via UPS to the confirmed temporary address in [facility].    The patient will receive a drug information handout for each medication shipped and additional FDA Medication Guides as required.  Verified that patient has previously received a Conservation officer, historic buildings and a Surveyor, mining.    The patient or caregiver noted above participated in the development of this care plan and knows that they can request review of or adjustments to the care plan at any time.      All of the patient's questions and concerns have been addressed.    Alec Hines   [facility] Specialty Pharmacist

## 2021-09-29 MED FILL — BIKTARVY 50 MG-200 MG-25 MG TABLET: ORAL | 30 days supply | Qty: 30 | Fill #11

## 2021-09-29 MED FILL — CLINDAMYCIN 1 % LOTION: TOPICAL | 15 days supply | Qty: 60 | Fill #2

## 2021-10-08 ENCOUNTER — Ambulatory Visit: Admit: 2021-10-08 | Payer: PRIVATE HEALTH INSURANCE | Attending: Family | Primary: Family

## 2021-10-08 NOTE — Unmapped (Signed)
No show. Line is busy.

## 2021-10-22 DIAGNOSIS — H539 Unspecified visual disturbance: Principal | ICD-10-CM

## 2021-10-22 DIAGNOSIS — H5789 Other specified disorders of eye and adnexa: Principal | ICD-10-CM

## 2021-10-22 DIAGNOSIS — Z21 Asymptomatic human immunodeficiency virus [HIV] infection status: Principal | ICD-10-CM

## 2021-10-22 DIAGNOSIS — Z717 Human immunodeficiency virus [HIV] counseling: Principal | ICD-10-CM

## 2021-10-22 DIAGNOSIS — B20 Human immunodeficiency virus [HIV] disease: Principal | ICD-10-CM

## 2021-10-22 DIAGNOSIS — R21 Rash and other nonspecific skin eruption: Principal | ICD-10-CM

## 2021-10-22 DIAGNOSIS — Z7252 High risk homosexual behavior: Principal | ICD-10-CM

## 2021-10-22 DIAGNOSIS — L739 Follicular disorder, unspecified: Principal | ICD-10-CM

## 2021-10-22 DIAGNOSIS — L709 Acne, unspecified: Principal | ICD-10-CM

## 2021-10-22 MED ORDER — CLINDAMYCIN 1 % LOTION
Freq: Two times a day (BID) | TOPICAL | 2 refills | 0.00000 days | Status: CP
Start: 2021-10-22 — End: 2022-10-22
  Filled 2021-10-29: qty 60, 15d supply, fill #0

## 2021-10-22 MED ORDER — BIKTARVY 50 MG-200 MG-25 MG TABLET
ORAL_TABLET | Freq: Every day | ORAL | 11 refills | 30 days | Status: CP
Start: 2021-10-22 — End: 2022-10-22
  Filled 2021-10-29: qty 30, 30d supply, fill #0

## 2021-10-22 NOTE — Unmapped (Signed)
[facility] Refill Coordination Note    Specialty Medication(s) to be Shipped:   Infectious Disease: Biktarvy    Other medication(s) to be shipped: clindamycin lotion     Almyra Deforest, DOB: 2001/10/09  Phone: [phone] (home)       All above HIPAA information was verified with patient.     Was a Nurse, learning disability used for this call? No    Completed refill call assessment today to schedule patient's medication shipment from the [facility] [phone]).  All relevant notes have been reviewed.     Specialty medication(s) and dose(s) confirmed: Regimen is correct and unchanged.   Changes to medications: Rowin reports no changes at this time.  Changes to insurance: No  New side effects reported not previously addressed with a pharmacist or physician: None reported  Questions for the pharmacist: No    Confirmed patient received a Conservation officer, historic buildings and a Surveyor, mining with first shipment. The patient will receive a drug information handout for each medication shipped and additional FDA Medication Guides as required.       DISEASE/MEDICATION-SPECIFIC INFORMATION        N/A    SPECIALTY MEDICATION ADHERENCE     Medication Adherence    Patient reported X missed doses in the last month: 0  Specialty Medication: biktarvy              Were doses missed due to medication being on hold? No    biktarvy  : 8 days of medicine on hand       REFERRAL TO PHARMACIST     Referral to the pharmacist: Not needed      [facility]     Shipping address confirmed in Epic.     Delivery Scheduled: Yes, Expected medication delivery date: 12/29.  However, Rx request for refills was sent to the provider as there are none remaining.     Medication will be delivered via Next Day Courier to the prescription address in Epic WAM.    [name]   [facility] Specialty Technician

## 2021-11-18 ENCOUNTER — Ambulatory Visit: Admit: 2021-11-18 | Payer: PRIVATE HEALTH INSURANCE | Attending: Family | Primary: Family

## 2021-11-20 NOTE — Unmapped (Signed)
[facility] Refill Coordination Note    Specialty Medication(s) to be Shipped:   Infectious Disease: Biktarvy    Other medication(s) to be shipped: clindamycin 1% lotion     Alec Hines, DOB: 11/12/2000  Phone: [phone] (home)       All above HIPAA information was verified with patient.     Was a Nurse, learning disability used for this call? No    Completed refill call assessment today to schedule patient's medication shipment from the [facility] [phone]).  All relevant notes have been reviewed.     Specialty medication(s) and dose(s) confirmed: Regimen is correct and unchanged.   Changes to medications: Alec Hines reports no changes at this time.  Changes to insurance: No  New side effects reported not previously addressed with a pharmacist or physician: None reported  Questions for the pharmacist: No    Confirmed patient received a Conservation officer, historic buildings and a Surveyor, mining with first shipment. The patient will receive a drug information handout for each medication shipped and additional FDA Medication Guides as required.       DISEASE/MEDICATION-SPECIFIC INFORMATION        N/A    SPECIALTY MEDICATION ADHERENCE     Medication Adherence    Patient reported X missed doses in the last month: 0  Specialty Medication: biktarvy              Were doses missed due to medication being on hold? No    biktarvy  : 5 days of medicine on hand       REFERRAL TO PHARMACIST     Referral to the pharmacist: Not needed      [facility]     Shipping address confirmed in Epic.     Delivery Scheduled: Yes, Expected medication delivery date: 1/25.     Medication will be delivered via UPS to the temporary address in Epic WAM.    Alec Hines   [facility] Specialty Technician

## 2021-11-24 ENCOUNTER — Ambulatory Visit: Admit: 2021-11-24 | Payer: PRIVATE HEALTH INSURANCE | Attending: Family | Primary: Family

## 2021-11-24 DIAGNOSIS — Z717 Human immunodeficiency virus [HIV] counseling: Principal | ICD-10-CM

## 2021-11-24 DIAGNOSIS — Z21 Asymptomatic human immunodeficiency virus [HIV] infection status: Principal | ICD-10-CM

## 2021-11-24 DIAGNOSIS — B20 Human immunodeficiency virus [HIV] disease: Principal | ICD-10-CM

## 2021-11-24 NOTE — Unmapped (Signed)
Complex Case Management  SUMMARY NOTE    Attempted to contact pt today at Cell number to introduce Complex Case Management services. Left message to return call.; 1st attempt    Discuss at next visit: Introduction to Complex Case Management       Menachem Urbanek - High Risk Care Coordinator  Complex Case Management Program  [facility]  [address], [address], [place], [place] [address]  P: [phone] F: [phone]

## 2021-11-25 NOTE — Unmapped (Signed)
Alec [facility]ines 's Biktarvy shipment will be delayed as a result of [employer] insurance termed. Pt has managed medicaid but it rejects as non primary.     I have reached out to the patient  at [phone] and communicated the delay. We will wait for a call back from the patient to reschedule the delivery.  We have not confirmed the new delivery date.      Pt must call managed medicaid ([employer]) and let them know that he does not have any other active insurance, pt is aware and stated he would give them a call.

## 2021-11-30 NOTE — Unmapped (Signed)
Complex Case Management  SUMMARY NOTE    High Risk Care Coordinator  spoke with patient and verified correct patient using two identifiers today to introduce the Complex Case Management program.     Discussed the following:  Program Services    Program status: Declined       Saidy Ormand - High Risk Care Coordinator  Complex Case Management Program  [facility]  [address], [place], [place] [address]  P: [phone] F: [phone]

## 2021-12-02 DIAGNOSIS — B20 Human immunodeficiency virus [HIV] disease: Principal | ICD-10-CM

## 2021-12-02 MED FILL — BIKTARVY 50 MG-200 MG-25 MG TABLET: ORAL | 30 days supply | Qty: 30 | Fill #1

## 2021-12-02 NOTE — Unmapped (Signed)
Alec Hines 's Biktarvy shipment will be sent out  as a result of insurance issues fixed.     I have reached out to the patient  at [phone] and communicated the delivery change. We will reschedule the medication for the delivery date that the patient agreed upon.  We have confirmed the delivery date as 2/2, via ups.

## 2021-12-26 NOTE — Unmapped (Signed)
[facility] Refill Coordination Note    Specialty Medication(s) to be Shipped:   Infectious Disease: Biktarvy    Other medication(s) to be shipped: No additional medications requested for fill at this time     Alec Hines, DOB: Mar 14, 2001  Phone: [phone] (home)       All above HIPAA information was verified with patient.     Was a Nurse, learning disability used for this call? No    Completed refill call assessment today to schedule patient's medication shipment from the [facility] [phone]).  All relevant notes have been reviewed.     Specialty medication(s) and dose(s) confirmed: Regimen is correct and unchanged.   Changes to medications: [place] reports no changes at this time.  Changes to insurance: No  New side effects reported not previously addressed with a pharmacist or physician: None reported  Questions for the pharmacist: No    Confirmed patient received a Conservation officer, historic buildings and a Surveyor, mining with first shipment. The patient will receive a drug information handout for each medication shipped and additional FDA Medication Guides as required.       DISEASE/MEDICATION-SPECIFIC INFORMATION        N/A    SPECIALTY MEDICATION ADHERENCE     Medication Adherence    Patient reported X missed doses in the last month: 0  Specialty Medication: biktarvy              Were doses missed due to medication being on hold? No     biktarvy  : 6 days of medicine on hand       REFERRAL TO PHARMACIST     Referral to the pharmacist: Not needed      [facility]     Shipping address confirmed in Epic.     Delivery Scheduled: Yes, Expected medication delivery date: 3/1.     Medication will be delivered via UPS to the temporary address in Epic WAM.    [name]   [facility] Specialty Technician

## 2021-12-29 MED FILL — BIKTARVY 50 MG-200 MG-25 MG TABLET: ORAL | 30 days supply | Qty: 30 | Fill #2

## 2022-01-26 IMAGING — CT CT NECK W/ CM
4 series · 15 of 35 positions shown, 18 images · IV contrast (APPLIED)
Comparison: None.

CLINICAL DATA: Epiglottitis or tonsillitis suspected. Significant
bilateral swelling of the peritonsillar space, evaluate for PTA.
Sore throat with difficulty swallowing and bilateral neck swelling.
Worsening symptoms over the past week.

EXAM:
CT NECK WITH CONTRAST
TECHNIQUE: Multidetector CT imaging of the neck was performed using the
standard protocol following the bolus administration of intravenous
contrast.
CONTRAST:  50mL OMNIPAQUE IOHEXOL 350 MG/ML SOLN

[Series 3: axial neck · axial · 0.61mm/px · z∈[-247,-79]mm · 5 of 128 slices shown, 7 images]
[im 22/128  soft-tissue]
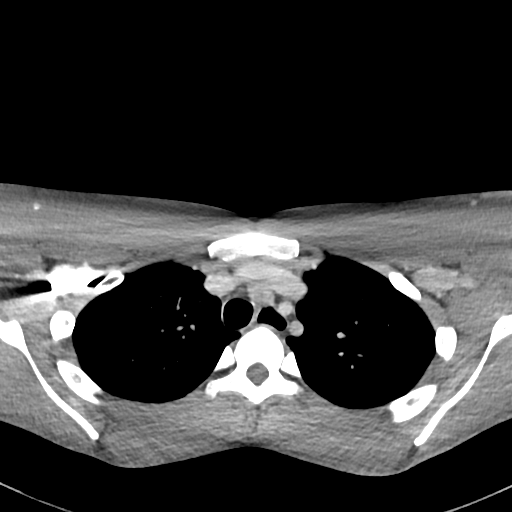
[im 22/128  bone]
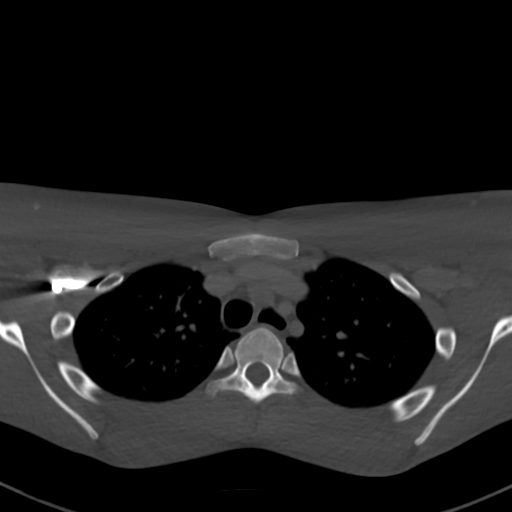
[im 43/128  bone]
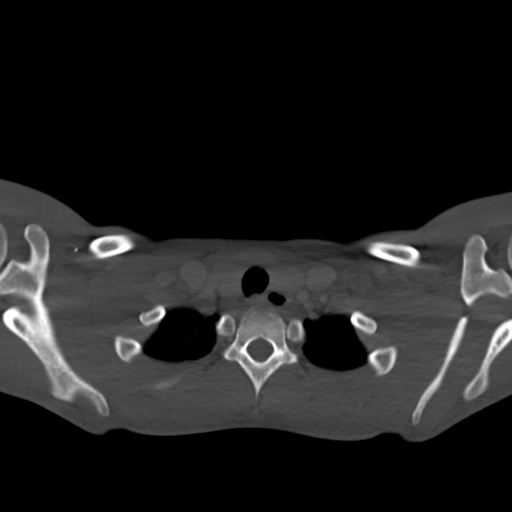
[im 64/128  bone]
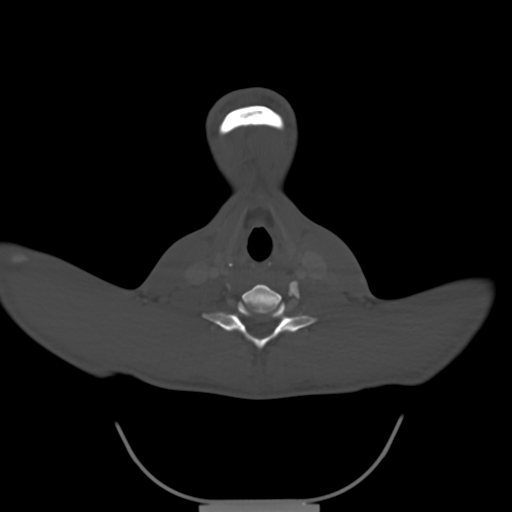
[im 85/128  bone]
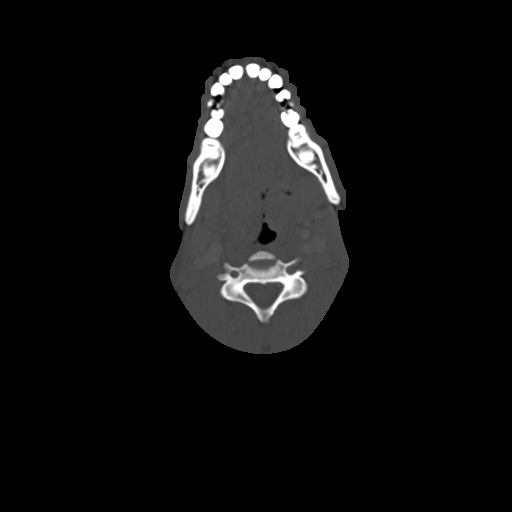
[im 106/128  soft-tissue]
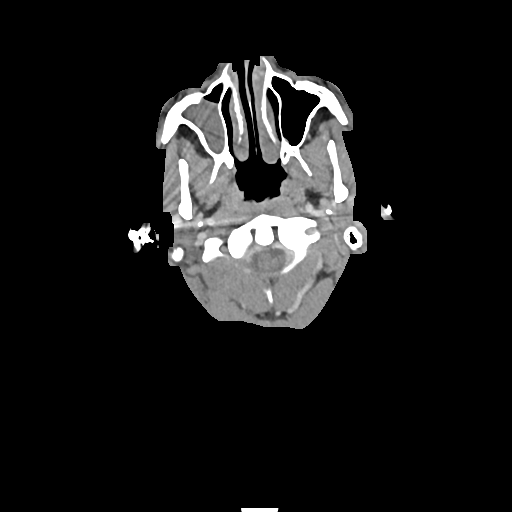
[im 106/128  bone]
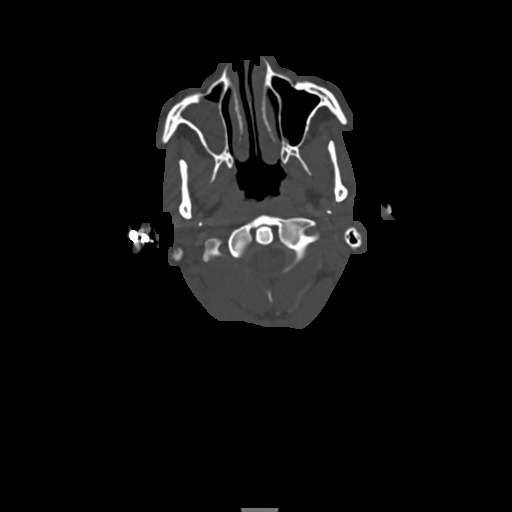

[Series 5: axial bone · axial · 0.61mm/px · z∈[-247,-205]mm · 2 of 129 slices shown]
[im 22/129  bone]
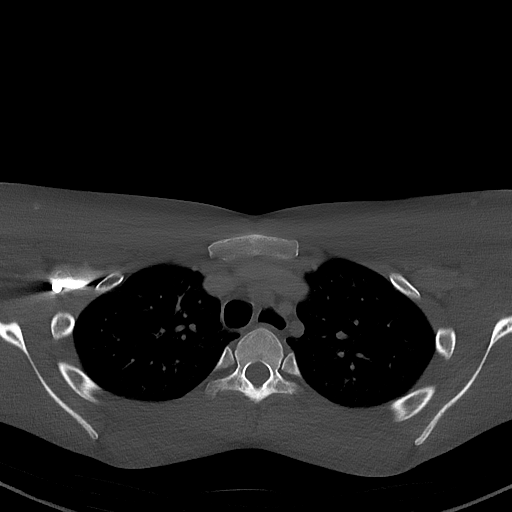
[im 43/129  bone]
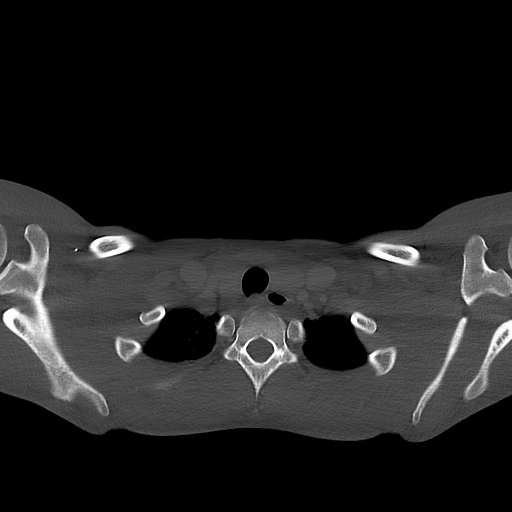

[Series 6: sag neck · sagittal · 0.54mm/px · 5 of 97 slices shown, 6 images]
[im 33/97  bone]
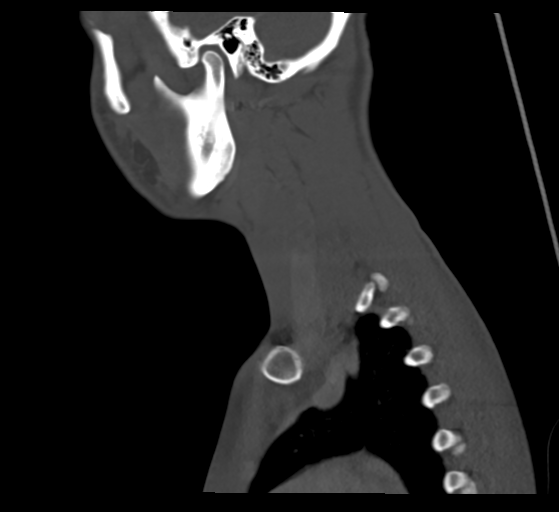
[im 41/97  bone]
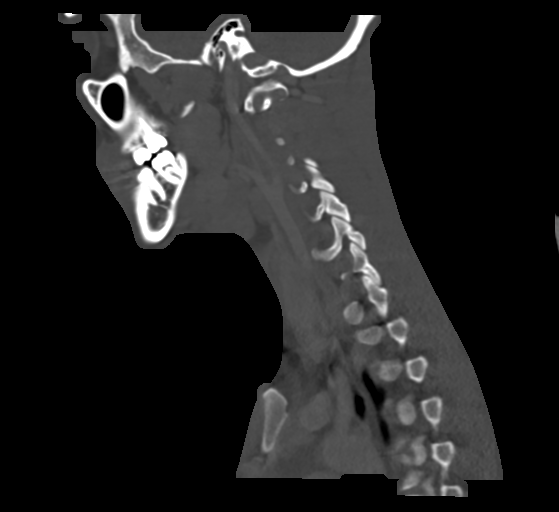
[im 49/97  soft-tissue]
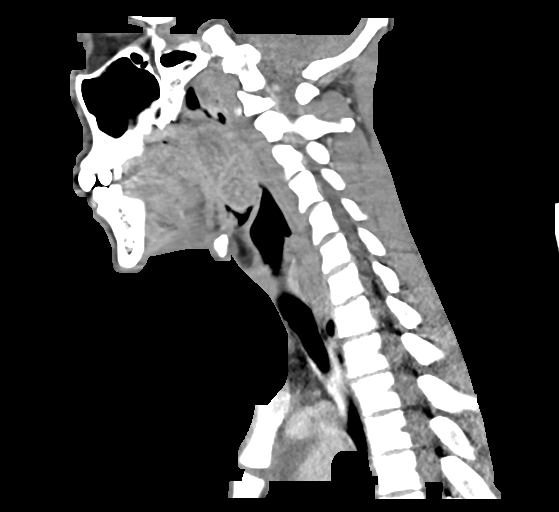
[im 49/97  bone]
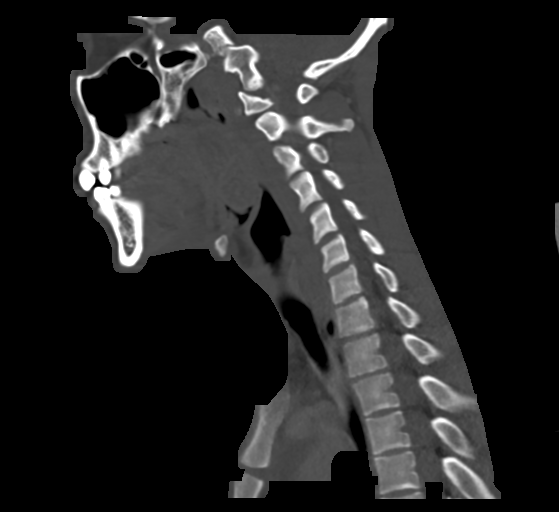
[im 57/97  bone]
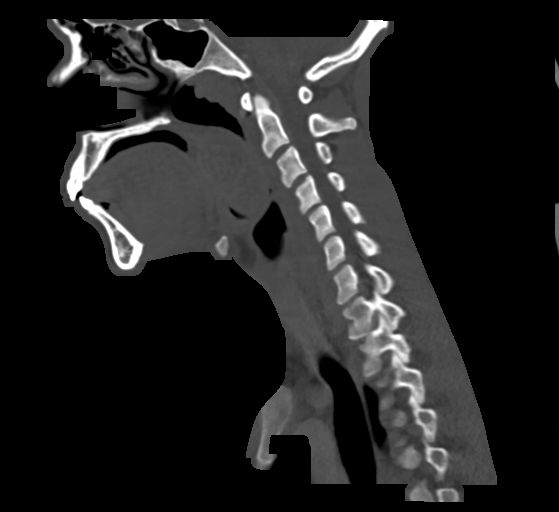
[im 65/97  bone]
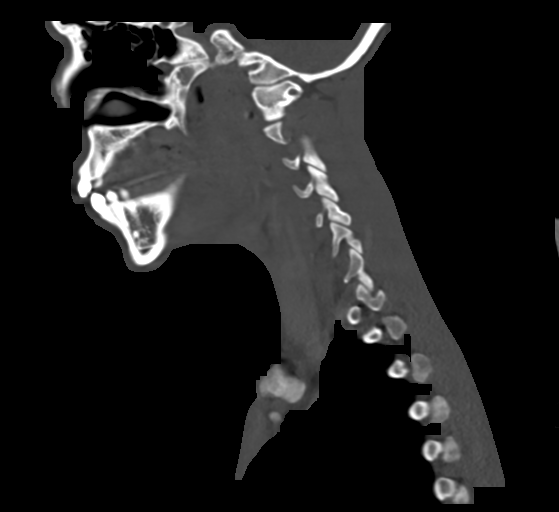

[Series 7: cor neck · coronal · 0.55mm/px · 3 of 118 slices shown]
[im 24/118  bone]
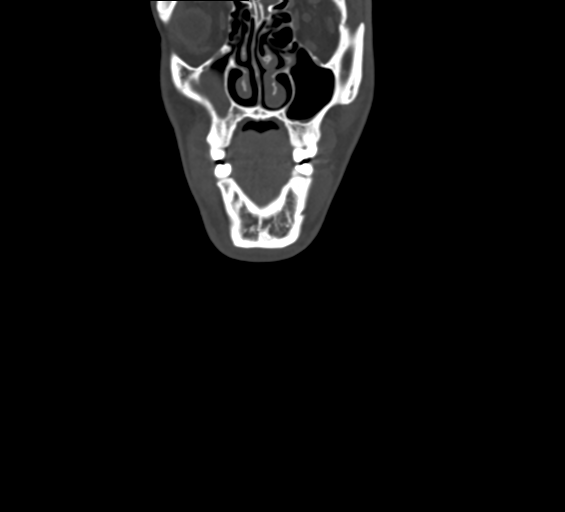
[im 47/118  bone]
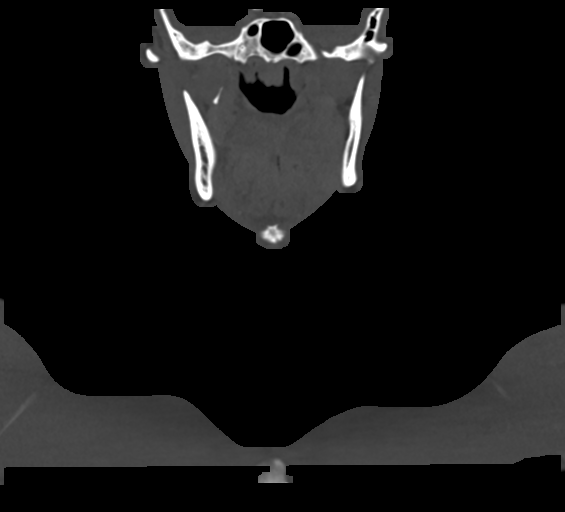
[im 71/118  bone]
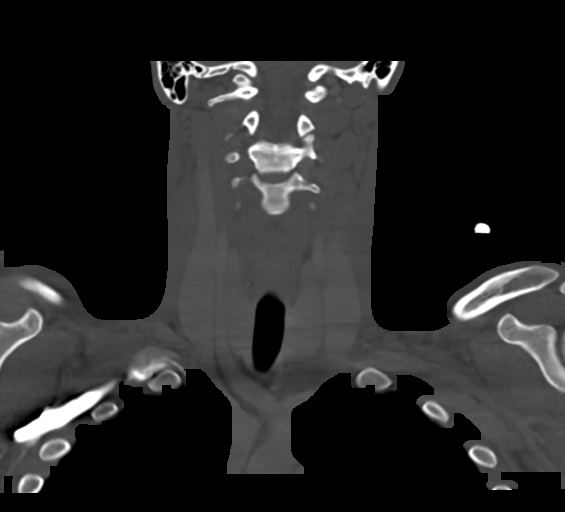

[15 of 35 positions shown; findings below may reference images not displayed]

FINDINGS: Pharynx and larynx: Prominent enlargement of the palatine tonsils
bilaterally with striated enhancement narrowing the upper
oropharyngeal airway. No epiglottic swelling. No peritonsillar or
retropharyngeal fluid collection.

Salivary glands: No inflammation, mass, or stone.

Thyroid: Unremarkable.

Lymph nodes: Enlarged level II lymph nodes measuring up to 1.5 cm in
short axis bilaterally.

Vascular: Major vascular structures of the neck are grossly patent.

Limited intracranial: Unremarkable.

Visualized orbits: Unremarkable.

Mastoids and visualized paranasal sinuses: Large mucous retention
cyst in the right maxillary sinus. Clear mastoid air cells.

Skeleton: No acute osseous abnormality or suspicious osseous lesion.

Upper chest: No apical lung consolidation or mass.

Other: None.
IMPRESSION: 1. Acute tonsillitis. No abscess.
2. Bilateral level II lymphadenopathy, likely reactive.

## 2022-01-29 NOTE — Unmapped (Signed)
[facility] Refill Coordination Note    Specialty Medication(s) to be Shipped:   Infectious Disease: Biktarvy    Other medication(s) to be shipped: No additional medications requested for fill at this time     Alec Hines, DOB: 31-Jul-2001  Phone: [phone] (home)       All above HIPAA information was verified with patient.     Was a Nurse, learning disability used for this call? No    Completed refill call assessment today to schedule patient's medication shipment from the [facility] [phone]).  All relevant notes have been reviewed.     Specialty medication(s) and dose(s) confirmed: Regimen is correct and unchanged.   Changes to medications: [name] reports no changes at this time.  Changes to insurance: No  New side effects reported not previously addressed with a pharmacist or physician: None reported  Questions for the pharmacist: No    Confirmed patient received a Conservation officer, historic buildings and a Surveyor, mining with first shipment. The patient will receive a drug information handout for each medication shipped and additional FDA Medication Guides as required.       DISEASE/MEDICATION-SPECIFIC INFORMATION        N/A    SPECIALTY MEDICATION ADHERENCE     Medication Adherence    Patient reported X missed doses in the last month: 2  Specialty Medication: biktarvy              Were doses missed due to medication being on hold? No    biktarvy  : 10 days of medicine on hand       REFERRAL TO PHARMACIST     Referral to the pharmacist: Yes - routine compliance concerns. Patient has missed 1-3 doses of medication. Refills were scheduled and concern routed to pharmacist for evaluation.      SHIPPING     Shipping address confirmed in Epic.     Delivery Scheduled: Yes, Expected medication delivery date: 4/5.     Medication will be delivered via UPS to the prescription address in Epic WAM.    [name]   [facility] Specialty Technician

## 2022-02-01 NOTE — Unmapped (Signed)
Alec Hines called and was concerned about stomach pain. Pt shared he drank more alcohol on Sat then usual. It wants to make sure this isn't a reaction to his Biktary. Pt requested a return call

## 2022-02-02 MED FILL — BIKTARVY 50 MG-200 MG-25 MG TABLET: ORAL | 30 days supply | Qty: 30 | Fill #3

## 2022-02-22 ENCOUNTER — Ambulatory Visit: Admit: 2022-02-22 | Payer: PRIVATE HEALTH INSURANCE | Attending: Family | Primary: Family

## 2022-02-24 NOTE — Unmapped (Signed)
NO SHOW. NO VOICEMAIL SET UP

## 2022-02-27 NOTE — Unmapped (Signed)
[facility] Clinical Assessment & Refill Coordination Note    Alec Hines, [place]: Nov 02, 2000  Phone: [phone] (home)     All above HIPAA information was verified with patient.     Was a Nurse, learning disability used for this call? No    Specialty Medication(s):   Infectious Disease: Biktarvy     Current Outpatient Medications   Medication Sig Dispense Refill    atropine 1 % ophthalmic solution PLACE 1 DROP INTO THE LEFT EYE 2 TIMES DAILY.      bictegrav-emtricit-tenofov ala (BIKTARVY) 50-200-25 mg tablet Take 1 tablet by mouth daily. 30 tablet 11    chlorhexidine (HIBICLENS) 4 % external liquid Apply to affected areas once a week.  Allow to dry 1 hour then shower off. 118 mL 0    clindamycin (CLEOCIN T) 1 % lotion Apply topically Two (2) times a day. 60 mL 2     No current facility-administered medications for this visit.        Changes to medications: Alec Hines reports no changes at this time.    No Known Allergies    Changes to allergies: No    SPECIALTY MEDICATION ADHERENCE     Biktarvy 50-200-25 mg: approximately 7 days of medicine on hand       Medication Adherence    Patient reported X missed doses in the last month: 0  Specialty Medication: Biktarvy 50-200-25mg   Patient is on additional specialty medications: No  Any gaps in refill history greater than 2 weeks in the last 3 months: no  Demonstrates understanding of importance of adherence: yes  Informant: patient  Provider-estimated medication adherence level: good  Patient is at risk for Non-Adherence: No          Specialty medication(s) dose(s) confirmed: Regimen is correct and unchanged.     Are there any concerns with adherence? No    Adherence counseling provided? Not needed    CLINICAL MANAGEMENT AND INTERVENTION      Clinical Benefit Assessment:    Do you feel the medicine is effective or helping your condition? Yes    HIV ASSOCIATED LABS:     Lab Results   Component Value Date/Time    HIVRS Detected (A) 06/10/2021 11:08 AM HIVRS Not Detected 03/10/2021 12:03 PM    HIVRS Not Detected 12/11/2020 09:40 AM    HIVCP 60 (H) 06/10/2021 11:08 AM    HIVCP 32,305 (H) 10/16/2020 12:27 PM    HIVCP 160,599 (H) 03/05/2020 03:03 PM    ACD4 780 06/10/2021 11:08 AM    ACD4 513 03/10/2021 12:03 PM    ACD4 798 12/11/2020 09:40 AM       Clinical Benefit counseling provided? Not needed    Adverse Effects Assessment:    Are you experiencing any side effects? No    Are you experiencing difficulty administering your medicine? No    Quality of Life Assessment:    How many days over the past month did your HIV  keep you from your normal activities? For example, brushing your teeth or getting up in the morning. 0    Have you discussed this with your provider? Not needed    Acute Infection Status:    Acute infections noted within Epic:  No active infections  Patient reported infection: None    Therapy Appropriateness:    Is therapy appropriate and patient progressing towards therapeutic goals? Yes, therapy is appropriate and should be continued    DISEASE/MEDICATION-SPECIFIC INFORMATION      N/A  PATIENT SPECIFIC NEEDS     Does the patient have any physical, cognitive, or cultural barriers? No    Is the patient high risk? No    Does the patient require a Care Management Plan? No       Specialty Medication(s) to be Shipped:   Infectious Disease: Biktarvy    Other medication(s) to be shipped: No additional medications requested for fill at this time     Changes to insurance: No    Delivery Scheduled: Yes, Expected medication delivery date: 03/03/22.     Medication will be delivered via UPS to the confirmed temporary address in [facility]    The patient will receive a drug information handout for each medication shipped and additional FDA Medication Guides as required.  Verified that patient has previously received a Conservation officer, historic buildings and a Surveyor, mining.    The patient or caregiver noted above participated in the development of this care plan and knows that they can request review of or adjustments to the care plan at any time.      All of the patient's questions and concerns have been addressed.    [name]   [facility] Specialty Pharmacist

## 2022-03-02 MED FILL — BIKTARVY 50 MG-200 MG-25 MG TABLET: ORAL | 30 days supply | Qty: 30 | Fill #4

## 2022-04-02 NOTE — Unmapped (Signed)
[facility] Refill Coordination Note    Specialty Medication(s) to be Shipped:   Infectious Disease: Biktarvy    Other medication(s) to be shipped: No additional medications requested for fill at this time     Alec Hines, DOB: [birth year]  Phone: [phone] (home)       All above HIPAA information was verified with patient.     Was a Nurse, learning disability used for this call? No    Completed refill call assessment today to schedule patient's medication shipment from the [facility] [phone]).  All relevant notes have been reviewed.     Specialty medication(s) and dose(s) confirmed: Regimen is correct and unchanged.   Changes to medications: [place] reports no changes at this time.  Changes to insurance: No  New side effects reported not previously addressed with a pharmacist or physician: None reported  Questions for the pharmacist: No    Confirmed patient received a Conservation officer, historic buildings and a Surveyor, mining with first shipment. The patient will receive a drug information handout for each medication shipped and additional FDA Medication Guides as required.       DISEASE/MEDICATION-SPECIFIC INFORMATION        N/A    SPECIALTY MEDICATION ADHERENCE     Medication Adherence    Patient reported X missed doses in the last month: 2  Specialty Medication: biktarvy  Patient is on additional specialty medications: No              Were doses missed due to medication being on hold? No    Biktarvy 50-200-25  mg: 7 days of medicine on hand        REFERRAL TO PHARMACIST     Referral to the pharmacist: Not needed      [facility]     Shipping address confirmed in Epic.     Delivery Scheduled: Yes, Expected medication delivery date: 04/06/22.     Medication will be delivered via UPS to the temporary address in Epic WAM.    Willette Pa   [facility] Specialty Technician

## 2022-04-05 MED FILL — BIKTARVY 50 MG-200 MG-25 MG TABLET: ORAL | 30 days supply | Qty: 30 | Fill #5

## 2022-04-15 ENCOUNTER — Ambulatory Visit: Admit: 2022-04-15 | Payer: PRIVATE HEALTH INSURANCE | Attending: Family | Primary: Family

## 2022-04-15 NOTE — Unmapped (Signed)
NO SHOW. Called to r/s. VM not set up.

## 2022-05-06 NOTE — Unmapped (Addendum)
[facility] Refill Coordination Note    Specialty Medication(s) to be Shipped:   Infectious Disease: Biktarvy    Other medication(s) to be shipped: No additional medications requested for fill at this time     Alec Hines, DOB: 2001/07/03  Phone: [phone] (home)       All above HIPAA information was verified with patient.     Was a Nurse, learning disability used for this call? No    Completed refill call assessment today to schedule patient's medication shipment from the [facility] [phone]).  All relevant notes have been reviewed.     Specialty medication(s) and dose(s) confirmed: Regimen is correct and unchanged.   Changes to medications: [place] reports no changes at this time.  Changes to insurance: No  New side effects reported not previously addressed with a pharmacist or physician: None reported  Questions for the pharmacist: No    Confirmed patient received a Conservation officer, historic buildings and a Surveyor, mining with first shipment. The patient will receive a drug information handout for each medication shipped and additional FDA Medication Guides as required.       DISEASE/MEDICATION-SPECIFIC INFORMATION        N/A    SPECIALTY MEDICATION ADHERENCE     Medication Adherence    Patient reported X missed doses in the last month: 0  Specialty Medication: biktarvy              Were doses missed due to medication being on hold? No    biktarvy  : 7 days of medicine on hand       REFERRAL TO PHARMACIST     Referral to the pharmacist: Not needed      [facility]     Shipping address confirmed in Epic.     Delivery Scheduled: Yes, Expected medication delivery date: 7/11.     Medication will be delivered via UPS to the temporary address in Epic WAM.    [name]   [facility] Specialty Technician

## 2022-05-11 MED FILL — BIKTARVY 50 MG-200 MG-25 MG TABLET: ORAL | 30 days supply | Qty: 30 | Fill #6

## 2022-06-07 ENCOUNTER — Ambulatory Visit: Admit: 2022-06-07 | Discharge: 2022-06-08 | Payer: PRIVATE HEALTH INSURANCE | Attending: Family | Primary: Family

## 2022-06-07 ENCOUNTER — Ambulatory Visit: Admit: 2022-06-07 | Discharge: 2022-06-08 | Payer: PRIVATE HEALTH INSURANCE

## 2022-06-07 DIAGNOSIS — B20 Human immunodeficiency virus [HIV] disease: Principal | ICD-10-CM

## 2022-06-07 DIAGNOSIS — Z79899 Other long term (current) drug therapy: Principal | ICD-10-CM

## 2022-06-07 DIAGNOSIS — L739 Follicular disorder, unspecified: Principal | ICD-10-CM

## 2022-06-07 DIAGNOSIS — A530 Latent syphilis, unspecified as early or late: Principal | ICD-10-CM

## 2022-06-07 DIAGNOSIS — Z717 Human immunodeficiency virus [HIV] counseling: Principal | ICD-10-CM

## 2022-06-07 DIAGNOSIS — Z7252 High risk homosexual behavior: Principal | ICD-10-CM

## 2022-06-07 LAB — COMPREHENSIVE METABOLIC PANEL
ALBUMIN: 4.4 g/dL (ref 3.4–5.0)
ALKALINE PHOSPHATASE: 45 U/L — ABNORMAL LOW (ref 46–116)
ALT (SGPT): 14 U/L (ref 10–49)
ANION GAP: 6 mmol/L (ref 3–11)
AST (SGOT): 21 U/L (ref <34)
BILIRUBIN TOTAL: 0.8 mg/dL (ref 0.3–1.2)
BLOOD UREA NITROGEN: 9 mg/dL (ref 9–23)
BUN / CREAT RATIO: 10
CALCIUM: 9.8 mg/dL (ref 8.7–10.4)
CHLORIDE: 107 mmol/L (ref 98–107)
CO2: 28 mmol/L (ref 20.0–31.0)
CREATININE: 0.9 mg/dL (ref 0.73–1.18)
EGFR CKD-EPI (2021) MALE: >90 mL/min/{1.73_m2} (ref >=60)
GLUCOSE RANDOM: 87 mg/dL (ref 70–179)
POTASSIUM: 4 mmol/L (ref 3.5–5.1)
PROTEIN TOTAL: 7.7 g/dL (ref 5.7–8.2)
SODIUM: 141 mmol/L (ref 136–145)

## 2022-06-07 LAB — CBC W/ AUTO DIFF
BASOPHILS ABSOLUTE COUNT: 0 10*9/L (ref 0.0–0.1)
BASOPHILS RELATIVE PERCENT: 0.7 %
EOSINOPHILS ABSOLUTE COUNT: 0.2 10*9/L (ref 0.0–0.5)
EOSINOPHILS RELATIVE PERCENT: 5 %
HEMATOCRIT: 42.5 % (ref 39.0–48.0)
HEMOGLOBIN: 14.5 g/dL (ref 12.9–16.5)
LYMPHOCYTES ABSOLUTE COUNT: 2.4 10*9/L (ref 1.1–3.6)
LYMPHOCYTES RELATIVE PERCENT: 52.7 %
MEAN CORPUSCULAR HEMOGLOBIN CONC: 34.2 g/dL (ref 32.0–36.0)
MEAN CORPUSCULAR HEMOGLOBIN: 30.6 pg (ref 25.9–32.4)
MEAN CORPUSCULAR VOLUME: 89.6 fL (ref 77.6–95.7)
MEAN PLATELET VOLUME: 8.2 fL (ref 6.8–10.7)
MONOCYTES ABSOLUTE COUNT: 0.3 10*9/L (ref 0.3–0.8)
MONOCYTES RELATIVE PERCENT: 7.4 %
NEUTROPHILS ABSOLUTE COUNT: 1.5 10*9/L — ABNORMAL LOW (ref 1.8–7.8)
NEUTROPHILS RELATIVE PERCENT: 34.2 %
PLATELET COUNT: 295 10*9/L (ref 150–450)
RED BLOOD CELL COUNT: 4.74 10*12/L (ref 4.26–5.60)
RED CELL DISTRIBUTION WIDTH: 15 % (ref 12.2–15.2)
WBC ADJUSTED: 4.5 10*9/L (ref 3.6–11.2)

## 2022-06-07 MED ORDER — CHLORHEXIDINE GLUCONATE 4 % TOPICAL LIQUID
TOPICAL | 0 refills | 0 days | Status: CP
Start: 2022-06-07 — End: ?

## 2022-06-07 MED ORDER — MUPIROCIN 2 % TOPICAL OINTMENT
Freq: Two times a day (BID) | TOPICAL | 2 refills | 0 days | Status: CP
Start: 2022-06-07 — End: 2022-07-07

## 2022-06-08 LAB — LYMPH MARKER LIMITED,FLOW
ABSOLUTE CD3 CNT: 1680 {cells}/uL (ref 915–3400)
ABSOLUTE CD4 CNT: 984 {cells}/uL (ref 510–2320)
ABSOLUTE CD8 CNT: 672 {cells}/uL (ref 180–1520)
CD3% (T CELLS): 70 % (ref 61–86)
CD4% (T HELPER): 41 % (ref 34–58)
CD4:CD8 RATIO: 1.5 (ref 0.9–4.8)
CD8% T SUPPRESR: 28 % (ref 12–38)

## 2022-06-08 LAB — HIV RNA, QUANTITATIVE, PCR: HIV RNA QNT RSLT: NOT DETECTED

## 2022-06-09 NOTE — Unmapped (Signed)
Assessment/Plan:      Diagnosis ICD-10-CM Associated Orders   1. Human immunodeficiency virus (HIV) disease (CMS-HCC)  B20 CBC w/ Differential     Metabolic panel, Comp (Chem10 + LFTs)     HIV RNA, Quantitative, PCR     Lymphocyte Markers Limited     Syphilis Screen     CBC w/ Differential     Metabolic panel, Comp (Chem10 + LFTs)     HIV RNA, Quantitative, PCR     Lymphocyte Markers Limited     Syphilis Screen     chlorhexidine (HIBICLENS) 4 % external liquid     mupirocin (BACTROBAN) 2 % ointment      2. 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 Folliculitis  L73.9 CBC w/ Differential     Metabolic panel, Comp (Chem10 + LFTs)     HIV RNA, Quantitative, PCR     Lymphocyte Markers Limited     Syphilis Screen     chlorhexidine (HIBICLENS) 4 % external liquid     mupirocin (BACTROBAN) 2 % ointment      5. Encounter for HIV counseling  Z71.7 CBC w/ Differential     Metabolic panel, Comp (Chem10 + LFTs)     HIV RNA, Quantitative, PCR     Lymphocyte Markers Limited     Syphilis Screen     CBC w/ Differential     Metabolic panel, Comp (Chem10 + LFTs)     HIV RNA, Quantitative, PCR     Lymphocyte Markers Limited     Syphilis Screen chlorhexidine (HIBICLENS) 4 % external liquid     mupirocin (BACTROBAN) 2 % ointment      6. On highly active antiretroviral therapy (HAART)  Z79.899 CBC w/ Differential     Metabolic panel, Comp (Chem10 + LFTs)     HIV RNA, Quantitative, PCR     Lymphocyte Markers Limited     Syphilis Screen     CBC w/ Differential     Metabolic panel, Comp (Chem10 + LFTs)     HIV RNA, Quantitative, PCR     Lymphocyte Markers Limited     Syphilis Screen     chlorhexidine (HIBICLENS) 4 % external liquid     mupirocin (BACTROBAN) 2 % ointment          Return in about 3 months (around 09/07/2022).  HPI:     Alec Hines is a 21 y.o. year old Black/African American male who presents today for follow-up. Patient diagnosed wih HIV on 02/22/20.  He was taking Truvada PrEP and went for follow up testing and HIV antibody returned positive.  Since then patient has been taking HAART Biktarvy. Compliance is pretty good, states he misses a few doses here and there. Discussed HIV biology, medication compliance, resistance, side effects, safe sex with condoms, follow up appointments. Last HIV RNA 56 CD4% 27 Absolute CD4 513. RPR 1:8 CMV DNA not detected. States he has a new partner since May. Discussed PrEP for his partner. He does have folliculitis in groin area and around his beard. He will continue hibiclens baths and topical bactroban. Has been using topical remedies for acne/rash clobetisol for facial acne, improving. Will collect HIV labs today.  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  Denies fever chills NVD. Follow up in 4 months.         Past Medical/Surgical History:     Past Medical History:   Diagnosis Date    Chlamydia     HIV disease (CMS-HCC)      No past surgical history on file.    Social History:     Social History     Socioeconomic History    Marital status: Single     Spouse name: None    Number of children: None    Years of education: None    Highest education level: None   Tobacco Use    Smoking status: Never    Smokeless tobacco: Never   Vaping Use    Vaping Use: Every day    Last attempt to quit: 12/04/2020    Substances: Nicotine, Flavoring   Substance and Sexual Activity    Alcohol use: Yes     Comment: every now and then    Drug use: Yes     Types: Marijuana       Family History:     Family History   Problem Relation Age of Onset    No Known Problems Mother     No Known Problems Father     No Known Problems Brother        Allergies:     Patient has no known allergies.    Current Medications:     Current Outpatient Medications   Medication Sig Dispense Refill    bictegrav-emtricit-tenofov ala (BIKTARVY) 50-200-25 mg tablet Take 1 tablet by mouth daily. 30 tablet 11    chlorhexidine (HIBICLENS) 4 % external liquid Apply topically once a week. For the next 6 weeks. Apply to groin area and buttocks. Allow to dry for 1 hour, then shower 946 mL 0    clindamycin (CLEOCIN T) 1 % lotion Apply topically Two (2) times a day. (Patient not taking: Reported on 06/07/2022) 60 mL 2    mupirocin (BACTROBAN) 2 % ointment Apply topically Two (2) times a day. 30 g 2     No current facility-administered medications for this visit.       ROS:     Constitutional: Negative for fever, chills, appetite change, fatigue and unexpected weight change.   HEENT:  Head: Negative for headache, head injury, dizziness, lightheadedness.  Eyes: Glasses or contact lenses.  Negative for pain, redness, excessive tearing, double or blurred vision, spots, specks, flashing lights, glaucoma, cataracts, photophobia.  Ears:  Negative for hearing change, tinnitus, vertigo, earache, infection, discharge.  Nose and sinuses: Negative for  frequent colds, nasal stuffiness, discharge or itching, hay fever, nosebleeds.  Throat: Negative for dental abscess, bleeding gums, dentures, sore tongue, dry mouth, frequent sore throats, hoarseness.    Neck:  Negative for lumps, swollen glands, goiter, pain, stiffness  Respiratory: Negative for cough, sputum, hemoptysis, dyspnea, wheezing, pleurisy, last chest x-ray.  Negative for asthma, bronchitis, emphysema, pneumonia, tuberculosis.    Cardiovascular: Negative for chest pain, palpitations, hypertension, rheumatic fever, heart murmurs, dyspnea, orthopnea, paroxysmal nocturnal dyspnea, edema, and leg swelling.   Gastrointestinal: Negative for dysphagia, nausea, vomiting, abdominal pain, diarrhea, constipation, blood in stool, abdominal distention, heartburn, appetite change, food intolerance, and anal bleeding.   Endocrine: Negative for cold intolerance, heat intolerance, polydipsia, polyphagia and polyuria.   Genitourinary: Negative for dysuria, urgency, frequency, flank pain, difficulty urinating, hematuria, hesitancy, dribbling, difficulty starting/stopping stream, and genital sores.   Musculoskeletal: Negative for myalgias, back pain, joint swelling, arthralgias, gait problem and neck stiffness.    Skin: Negative for color change, pallor, rash, itching, and wound.   Allergic/Immunologic: Negative for environmental allergies and food allergies.   Neurological: Negative for dizziness, tremors, seizures, syncope, speech difficulty, weakness, light-headedness, numbness and headaches.  Negative for changes in mood, attention, orientation, or speech.   Hematological: Negative for adenopathy. Does not bruise/bleed easily.  Negative transfusion reactions.   Psychiatric/Behavioral: Negative for suicidal ideas, hallucinations, behavioral problems, confusion, sleep disturbance, self-injury, dyphoric mood and agitation. The patient is not nervous/anxious.      Vital Signs:     Vitals:    06/07/22 1141   BP: 105/70   Pulse: 82   Resp: 12   Temp: 36.4 ??C (97.5 ??F)   SpO2: 96%     BP Readings from Last 3 Encounters:   06/07/22 105/70   06/10/21 104/64   03/10/21 107/71     Wt Readings from Last 3 Encounters:   06/07/22 66.7 kg (147 lb)   06/10/21 69.9 kg (154 lb)   03/10/21 75.8 kg (167 lb)       Immunizations:     Immunization History   Administered Date(s) Administered    COVID-19 VAC,MRNA,TRIS(12Y UP)(PFIZER)(GRAY CAP) 04/21/2021, 08/07/2021    COVID-19 VACC,MRNA,(PFIZER)(PF) 04/21/2021, 08/07/2021       Labs:     Platelet   Date Value Ref Range Status   06/07/2022 295 150 - 450 10*9/L Final     HGB   Date Value Ref Range Status   06/07/2022 14.5 12.9 - 16.5 g/dL Final     WBC   Date Value Ref Range Status   06/07/2022 4.5 3.6 - 11.2 10*9/L Final     BUN   Date Value Ref Range Status   06/07/2022 9 9 - 23 mg/dL Final     Creatinine   Date Value Ref Range Status   06/07/2022 0.90 0.73 - 1.18 mg/dL Final     ALT   Date Value Ref Range Status   06/07/2022 14 10 - 49 U/L Final     AST   Date Value Ref Range Status   06/07/2022 21 <34 U/L Final     Potassium   Date Value Ref Range Status   06/07/2022 4.0 3.5 - 5.1 mmol/L Final     CO2   Date Value Ref Range Status   06/07/2022 28.0 20.0 - 31.0 mmol/L Final     HIV RNA   Date Value Ref Range Status   06/10/2021 60 (H) <0 copies/mL Final     Hepatitis C Ab   Date Value Ref Range  Status   03/05/2020 Nonreactive Nonreactive Final     Gonorrhoeae NAA   Date Value Ref Range Status   03/05/2020 Negative Negative Final     Chlamydia trachomatis, NAA   Date Value Ref Range Status   03/05/2020 Negative Negative Final     CD4% (T Helper)   Date Value Ref Range Status   06/07/2022 41 34 - 58 % Final         Physical Exam:     General: Well-appearing, calm, well-groomed, well-nourished.  No distress  Eyes: PERRL. Extraocular muscles intact, sclera clear   ENT: Nares without discharge, oropharynx without lesions or exudate.   Neck: Supple, no thyromegaly, bruit, or JVD.   Lymph Nodes: No adenopathy (cervical, axillary)   Cardiovascular: RRR, S1 and S2 normal. No murmur, rub, or gallop.   Lungs: Clear to auscultation bilaterally without wheezes/crackles/rhonchi. Good air movement.   Skin: No rashes or lesions noted on limited exam. Warm and dry with no erythema or pallor.  Abdomen: Normoactive bowel sounds, abdomen soft, nontender, and nondistended, no hepatosplenomegaly or masses. Liver normal in size, no rebound or guarding.   Genitourinary: Deferred   Extremities: No cyanosis, clubbing or edema. No joint effusions, FROM without tenderness.   Musculoskeletal: No spine or costovertebral angle tenderness.   Neuro: Alert and oriented to person, place, and time. Cranial nerves II-XII grossly intact, normal gait, normal sensation throughout, normal cerebellar function.  Psychiatry: Alert and oriented to person, place, and time, conversant, appropriate affect.   Nursing notes and vitals reviewed

## 2022-06-10 LAB — SYPHILIS SCREEN: SYPHILIS AB IGG/IGM SCREEN: POSITIVE — AB

## 2022-06-11 NOTE — Unmapped (Signed)
[facility] Refill Coordination Note    Specialty Medication(s) to be Shipped:   Infectious Disease: Biktarvy    Other medication(s) to be shipped: No additional medications requested for fill at this time     Alec Hines, DOB: February 11, 2001  Phone: [phone] (home)       All above HIPAA information was verified with patient.     Was a Nurse, learning disability used for this call? No    Completed refill call assessment today to schedule patient's medication shipment from the [facility] [phone]).  All relevant notes have been reviewed.     Specialty medication(s) and dose(s) confirmed: Regimen is correct and unchanged.   Changes to medications: Alec Hines reports no changes at this time.  Changes to insurance: No  New side effects reported not previously addressed with a pharmacist or physician: None reported  Questions for the pharmacist: No    Confirmed patient received a Conservation officer, historic buildings and a Surveyor, mining with first shipment. The patient will receive a drug information handout for each medication shipped and additional FDA Medication Guides as required.       DISEASE/MEDICATION-SPECIFIC INFORMATION        N/A    SPECIALTY MEDICATION ADHERENCE     Medication Adherence    Patient reported X missed doses in the last month: 0  Specialty Medication: Biktarvy                          Were doses missed due to medication being on hold? No    Biktarvy 50-200-25mg   : 6 days of medicine on hand       REFERRAL TO PHARMACIST     Referral to the pharmacist: Not needed      [facility]     Shipping address confirmed in Epic.     Delivery Scheduled: Yes, Expected medication delivery date: 8/15.     Medication will be delivered via UPS to the temporary address in Epic WAM.    [name]   [facility] Specialty Technician

## 2022-06-13 LAB — RPR, REVERSE ALGORITHM REFLEX: SYPHILIS RPR SCREEN: REACTIVE — AB

## 2022-06-14 MED FILL — BIKTARVY 50 MG-200 MG-25 MG TABLET: ORAL | 30 days supply | Qty: 30 | Fill #7

## 2022-07-29 MED FILL — BIKTARVY 50 MG-200 MG-25 MG TABLET: ORAL | 30 days supply | Qty: 30 | Fill #8

## 2022-07-29 NOTE — Unmapped (Signed)
[facility] Clinical Assessment & Refill Coordination Note    Alec Hines, Alec Hines: 12/02/2000  Phone: [phone] (home)     All above HIPAA information was verified with patient.     Was a Nurse, learning disability used for this call? No    Specialty Medication(s):   Infectious Disease: Biktarvy     Current Outpatient Medications   Medication Sig Dispense Refill    bictegrav-emtricit-tenofov ala (BIKTARVY) 50-200-25 mg tablet Take 1 tablet by mouth daily. 30 tablet 11    chlorhexidine (HIBICLENS) 4 % external liquid Apply topically once a week. For the next 6 weeks. Apply to groin area and buttocks. Allow to dry for 1 hour, then shower 946 mL 0    clindamycin (CLEOCIN T) 1 % lotion Apply topically Two (2) times a day. (Patient not taking: Reported on 06/07/2022) 60 mL 2     No current facility-administered medications for this visit.        Changes to medications: Alec Hines reports no changes at this time.    No Known Allergies    Changes to allergies: No    SPECIALTY MEDICATION ADHERENCE     Biktarvy 50-200-25 mg: 4 days of medicine on hand       Medication Adherence    Patient reported X missed doses in the last month: 0  Specialty Medication: Biktarvy 50-200-25mg   Patient is on additional specialty medications: No  Any gaps in refill history greater than 2 weeks in the last 3 months: no  Demonstrates understanding of importance of adherence: yes  Informant: patient  Provider-estimated medication adherence level: good  Patient is at risk for Non-Adherence: No    Specialty medication(s) dose(s) confirmed: Regimen is correct and unchanged.     Are there any concerns with adherence? No    Adherence counseling provided? Not needed    CLINICAL MANAGEMENT AND INTERVENTION      Clinical Benefit Assessment:    Do you feel the medicine is effective or helping your condition? Yes    HIV ASSOCIATED LABS:     Lab Results   Component Value Date/Time    HIVRS Not Detected 06/07/2022 12:06 PM    HIVRS Detected (A) 06/10/2021 11:08 AM    HIVRS Not Detected 03/10/2021 12:03 PM    HIVCP 60 (H) 06/10/2021 11:08 AM    HIVCP 32,305 (H) 10/16/2020 12:27 PM    HIVCP 160,599 (H) 03/05/2020 03:03 PM    ACD4 984 06/07/2022 12:06 PM    ACD4 780 06/10/2021 11:08 AM    ACD4 513 03/10/2021 12:03 PM       Clinical Benefit counseling provided? Labs from 06/07/22 show evidence of clinical benefit    Adverse Effects Assessment:    Are you experiencing any side effects? No    Are you experiencing difficulty administering your medicine? No    Quality of Life Assessment:    How many days over the past month did your HIV  keep you from your normal activities? For example, brushing your teeth or getting up in the morning. 0    Have you discussed this with your provider? Not needed    Acute Infection Status:    Acute infections noted within Epic:  No active infections  Patient reported infection: None    Therapy Appropriateness:    Is therapy appropriate and patient progressing towards therapeutic goals? Yes, therapy is appropriate and should be continued    DISEASE/MEDICATION-SPECIFIC INFORMATION      N/A    PATIENT  SPECIFIC NEEDS     Does the patient have any physical, cognitive, or cultural barriers? No    Is the patient high risk? No    Does the patient require a Care Management Plan? No     SOCIAL DETERMINANTS OF HEALTH     At the [facility], we have learned that life circumstances - like trouble affording food, housing, utilities, or transportation can affect the health of many of our patients.   That is why we wanted to ask: are you currently experiencing any life circumstances that are negatively impacting your health and/or quality of life? Patient declined to answer    Social Determinants of Health     Financial Resource Strain: Not on file   Internet Connectivity: Not on file   Food Insecurity: Not on file   Tobacco Use: Low Risk  (06/09/2022)    Patient History     Smoking Tobacco Use: Never     Smokeless Tobacco Use: Never     Passive Exposure: Not on file   Housing/Utilities: Not on file   Alcohol Use: Not on file   Transportation Needs: Not on file   Substance Use: Not on file   Health Literacy: Low Risk  (12/12/2021)    Health Literacy     : Never   Physical Activity: Not on file   Interpersonal Safety: Not on file   Stress: Not on file   Intimate Partner Violence: Not on file   Depression: Not on file   Social Connections: Not on file       Would you be willing to receive help with any of the needs that you have identified today? Not applicable       SHIPPING     Specialty Medication(s) to be Shipped:   Infectious Disease: Biktarvy    Other medication(s) to be shipped: No additional medications requested for fill at this time     Changes to insurance: No    Delivery Scheduled: Yes, Expected medication delivery date: 07/29/22.     Medication will be delivered via Same Day Courier to the confirmed temporary address in [facility].    The patient will receive a drug information handout for each medication shipped and additional FDA Medication Guides as required.  Verified that patient has previously received a Conservation officer, historic buildings and a Surveyor, mining.    The patient or caregiver noted above participated in the development of this care plan and knows that they can request review of or adjustments to the care plan at any time.      All of the patient's questions and concerns have been addressed.    [name], PharmD   [facility] Specialty Pharmacist

## 2022-08-18 NOTE — Unmapped (Addendum)
[facility] Refill Coordination Note    Specialty Medication(s) to be Shipped:   Infectious Disease: Biktarvy    Other medication(s) to be shipped: No additional medications requested for fill at this time     Lum Pfab, DOB: 08-10-2001  Phone: [phone] (home)       All above HIPAA information was verified with patient.     Was a Nurse, learning disability used for this call? No    Completed refill call assessment today to schedule patient's medication shipment from the [facility] [phone]).  All relevant notes have been reviewed.     Specialty medication(s) and dose(s) confirmed: Regimen is correct and unchanged.   Changes to medications: Zachory reports no changes at this time.  Changes to insurance: No  New side effects reported not previously addressed with a pharmacist or physician: None reported  Questions for the pharmacist: No    Confirmed patient received a Conservation officer, historic buildings and a Surveyor, mining with first shipment. The patient will receive a drug information handout for each medication shipped and additional FDA Medication Guides as required.       DISEASE/MEDICATION-SPECIFIC INFORMATION        N/A    SPECIALTY MEDICATION ADHERENCE     Medication Adherence    Patient reported X missed doses in the last month: 0  Specialty Medication: biktarvy 50-200-25mg   Patient is on additional specialty medications: No  Patient is on more than two specialty medications: No                          Were doses missed due to medication being on hold? No    biktarvy 50-200-25mg   : 14 days of medicine on hand       REFERRAL TO PHARMACIST     Referral to the pharmacist: Not needed      [facility]     Shipping address confirmed in Epic.     Delivery Scheduled: Yes, Expected medication delivery date: 10/24.     Medication will be delivered via Next Day Courier to the temporary address in Epic WAM.    [name]   [facility] Specialty Technician

## 2022-08-23 DIAGNOSIS — Z717 Human immunodeficiency virus [HIV] counseling: Principal | ICD-10-CM

## 2022-08-23 DIAGNOSIS — Z21 Asymptomatic human immunodeficiency virus [HIV] infection status: Principal | ICD-10-CM

## 2022-08-23 DIAGNOSIS — B20 Human immunodeficiency virus [HIV] disease: Principal | ICD-10-CM

## 2022-08-24 NOTE — Unmapped (Signed)
Alec Hines 's biktarvy shipment will be delayed as a result of the patient's insurance being terminated.  Mfr Assistance referral submitted/pending.    I have reached out to the patient  at [phone] and communicated the delay. We will call the patient back to reschedule the delivery upon resolution. We have not confirmed the new delivery date.

## 2022-09-07 ENCOUNTER — Ambulatory Visit: Admit: 2022-09-07 | Payer: PRIVATE HEALTH INSURANCE | Attending: Family | Primary: Family

## 2022-09-07 NOTE — Unmapped (Signed)
No show. No voicemail set up

## 2022-09-14 MED FILL — BIKTARVY 50 MG-200 MG-25 MG TABLET: ORAL | 30 days supply | Qty: 30 | Fill #9

## 2022-09-14 NOTE — Unmapped (Signed)
Ordie Badgett 's biktarvy shipment will be sent out  as a result of mfr assistance approved.     I have reached out to the patient  at [phone] and communicated the delivery change. We will reschedule the medication for the delivery date that the patient agreed upon.  We have confirmed the delivery date as 11/15, via ups.

## 2022-09-22 NOTE — Unmapped (Signed)
Alec Hines 's Biktarvy shipment will be delayed as a result of UPS returned package      I have reached out to the patient  at [phone] and communicated the delivery change. We will reschedule the medication for the delivery date that the patient agreed upon.  We have confirmed the delivery date as 11/24, via ups.

## 2022-10-14 NOTE — Unmapped (Signed)
The [facility] has made a second and final attempt to reach this patient to refill the following medication:Biktarvy.      We have left voicemails on the following phone numbers: [phone] .    Dates contacted: 10/06/22 and 10/14/22  Last scheduled delivery: 09/14/22    The patient may be at risk of non-compliance with this medication. The patient should call the [facility] at [phone]  Option 4, then Option 2 (all other specialty patients) to refill medication.    Quintella Reichert   [facility] Specialty Technician

## 2022-11-05 DIAGNOSIS — B20 Human immunodeficiency virus [HIV] disease: Principal | ICD-10-CM

## 2022-11-05 DIAGNOSIS — Z717 Human immunodeficiency virus [HIV] counseling: Principal | ICD-10-CM

## 2022-11-05 DIAGNOSIS — Z21 Asymptomatic human immunodeficiency virus [HIV] infection status: Principal | ICD-10-CM

## 2022-11-05 MED ORDER — BIKTARVY 50 MG-200 MG-25 MG TABLET
ORAL_TABLET | Freq: Every day | ORAL | 11 refills | 30 days | Status: CP
Start: 2022-11-05 — End: 2023-11-05
  Filled 2022-11-10: qty 30, 30d supply, fill #0

## 2022-11-05 NOTE — Unmapped (Signed)
[facility] Refill Coordination Note    Patient aware he is overdue for appt and will call clinic now.     Specialty Medication(s) to be Shipped:   Infectious Disease: Biktarvy    Other medication(s) to be shipped: No additional medications requested for fill at this time     Alec Hines, DOB: 10/30/2001  Phone: [phone] (home)       All above HIPAA information was verified with patient.     Was a Nurse, learning disability used for this call? No    Completed refill call assessment today to schedule patient's medication shipment from the [facility] [phone]).  All relevant notes have been reviewed.     Specialty medication(s) and dose(s) confirmed: Regimen is correct and unchanged.   Changes to medications: Fynn reports no changes at this time.  Changes to insurance: No  New side effects reported not previously addressed with a pharmacist or physician: None reported  Questions for the pharmacist: No    Confirmed patient received a Conservation officer, historic buildings and a Surveyor, mining with first shipment. The patient will receive a drug information handout for each medication shipped and additional FDA Medication Guides as required.       DISEASE/MEDICATION-SPECIFIC INFORMATION        N/A    SPECIALTY MEDICATION ADHERENCE          Were doses missed due to medication being on hold? No    Biktarvy 50/200/25 mg: 7 days of medicine on hand       REFERRAL TO PHARMACIST     Referral to the pharmacist: Not needed      [facility]     Shipping address confirmed in Epic.     Delivery Scheduled: Yes, Expected medication delivery date: 1/10.  However, Rx request for refills was sent to the provider as there are none remaining.     Medication will be delivered via UPS to the prescription address in Epic WAM.    Julianne Rice, PharmD   [facility] Specialty Pharmacist

## 2022-11-23 NOTE — Unmapped (Signed)
Alec Hines 's Biktarvy shipment will be delayed as a result of UPS returned package      I have reached out to the patient  at [phone] and communicated the delivery change. We will reschedule the medication for the delivery date that the patient agreed upon.  We have confirmed the delivery date as 1/24, via ups.

## 2022-12-01 NOTE — Unmapped (Signed)
Alec Hines came to his appt today on 12/01/2022. His insurance was e-rejected. The patient had an electronic card on his phone that showed effective 11/01/2022 ID# [id number] I / Group # [id number]. I called [employer] at [phone]. I spoke with 2 different representatives and after giving all the information was put on hold twice and transferred a 3rd time. Approx time on phone with [employer] was approx 40 minutes. I finally was able to speak with a patient representative Felicia. However, she stated I had once again been transferred back to the customer line instead of the provider line. She apologized for the inconvenience and offered to look up the insurance for [place]. She was able to confirm his insurance was active 11/01/2022 til 06/01/2023. However, he didn't activate until today. Per Sunny Schlein  his insurance will be retroactive back till 11/01/2022. However, it takes 5 days to activate in the automated system/ Epic. To confirm she had to speak with myself and patient via speaker phone. The only reference # she could give was a patient reference # [id number]. Since I can not confirm in Epic. We decided to be safe and reschedule the patient so his insurance will be active in the system when he is seen.

## 2022-12-15 NOTE — Unmapped (Signed)
Patient lm on vm to cancel his appt until he receives his insurance card. I called him back to reschedule. He wants to wait till he has his insurance card in his hand. He will call back at that time to reschedule.

## 2022-12-16 NOTE — Unmapped (Signed)
[facility] Specialty Pharmacy Refill Coordination Note    Specialty Medication(s) to be Shipped:   Infectious Disease: Biktarvy    Other medication(s) to be shipped: No additional medications requested for fill at this time     Alec Hines, DOB: August 07, 2001  Phone: [phone] (home)       All above HIPAA information was verified with patient.     Was a Nurse, learning disability used for this call? No    Completed refill call assessment today to schedule patient's medication shipment from the [facility] [phone]).  All relevant notes have been reviewed.     Specialty medication(s) and dose(s) confirmed: Regimen is correct and unchanged.   Changes to medications: Alec Hines reports no changes at this time.  Changes to insurance: No  New side effects reported not previously addressed with a pharmacist or physician: None reported  Questions for the pharmacist: No    Confirmed patient received a Conservation officer, historic buildings and a Surveyor, mining with first shipment. The patient will receive a drug information handout for each medication shipped and additional FDA Medication Guides as required.       DISEASE/MEDICATION-SPECIFIC INFORMATION        N/A    SPECIALTY MEDICATION ADHERENCE     Medication Adherence    Patient reported X missed doses in the last month: 0  Specialty Medication: BIKTARVY 50-200-25 mg  Patient is on additional specialty medications: No              Were doses missed due to medication being on hold? No    Biktarvy 50-200-25 mg: 10 days of medicine on hand        REFERRAL TO PHARMACIST     Referral to the pharmacist: Not needed      [facility]     Shipping address confirmed in Epic.     Patient was notified of new phone menu : No    Delivery Scheduled: Yes, Expected medication delivery date: 12/22/22.     Medication will be delivered via UPS to the prescription address in Epic WAM.    Alec Hines   [facility] Specialty Technician

## 2023-01-06 NOTE — Unmapped (Signed)
Almyra Deforest 's Biktarvy shipment will be canceled  as a result of UPS returned package      I have reached out to the patient  at [phone] and left a voicemail message.  We will not reschedule the medication and have removed this/these medication(s) from the work request.  We have canceled this work request.

## 2023-01-07 NOTE — Unmapped (Signed)
Update on Biktarvy shipment:     I spoke with Mr. Shagena and his Susanne Borders is now rescheduled to be delivered to the verified prescription address in Epic [place] on Tuesday, 01/11/23 as requested by the patient.    Corliss Skains. [place], [place].D.  Specialty Pharmacist  [facility]  [phone] option 4 then option 2

## 2023-01-10 MED FILL — BIKTARVY 50 MG-200 MG-25 MG TABLET: ORAL | 30 days supply | Qty: 30 | Fill #1

## 2023-01-22 ENCOUNTER — Emergency Department (HOSPITAL_COMMUNITY)
Admission: EM | Admit: 2023-01-22 | Discharge: 2023-01-22 | Disposition: A | Discharge status: Home / Self Care | Payer: Worker's Compensation | Attending: Emergency Medicine | Admitting: Emergency Medicine

## 2023-01-22 DIAGNOSIS — Z21 Asymptomatic human immunodeficiency virus [HIV] infection status: Secondary | ICD-10-CM | POA: Diagnosis not present

## 2023-01-22 DIAGNOSIS — S61451A Open bite of right hand, initial encounter: Secondary | ICD-10-CM | POA: Diagnosis present

## 2023-01-22 DIAGNOSIS — S61431A Puncture wound without foreign body of right hand, initial encounter: Secondary | ICD-10-CM | POA: Insufficient documentation

## 2023-01-22 DIAGNOSIS — W540XXA Bitten by dog, initial encounter: Secondary | ICD-10-CM | POA: Diagnosis not present

## 2023-01-22 MED ORDER — AMOXICILLIN-POT CLAVULANATE 875-125 MG PO TABS: 1.0000 | ORAL_TABLET | Freq: Two times a day (BID) | ORAL | 0 refills | Status: AC

## 2023-01-22 MED ORDER — IBUPROFEN 400 MG PO TABS
600.0000 mg | ORAL_TABLET | Freq: Once | ORAL | Status: AC
  Administered 2023-01-22: 600 mg via ORAL
  Filled 2023-01-22: qty 1

## 2023-01-22 MED ORDER — AMOXICILLIN-POT CLAVULANATE 875-125 MG PO TABS
1.0000 | ORAL_TABLET | Freq: Once | ORAL | Status: AC
  Administered 2023-01-22: 1 via ORAL
  Filled 2023-01-22: qty 1

## 2023-01-22 NOTE — ED Provider Notes (Signed)
[facility] Provider Note   CSN: [id number] Arrival date & time: 01/22/23  1809     History  Chief Complaint  Patient presents with   Animal Bite    Alan Joseph is a 22 y.o. male.  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  Presents ER for dog bite to the right hand.  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  Last tetanus shot was March 2021.  Dog is up-to-date on his rabies vaccine, they have to have proof of rabies before grooming appointment.  He has no numbness or tingling, no limited range of motion.  This happened shortly prior to arrival.   Animal Bite      Home Medications Prior to Admission medications   Medication Sig Start Date End Date Taking? Authorizing Provider  amoxicillin-clavulanate (AUGMENTIN) 875-125 MG tablet Take 1 tablet by mouth every 12 (twelve) hours for 5 days. 01/22/23 01/27/23 Yes Aedan Geimer A, PA-C  ibuprofen (ADVIL) 800 MG tablet Take 1 tablet (800 mg total) by mouth every 8 (eight) hours as needed for up to 30 doses. 06/28/21   Wyvonnia Dusky, MD      Allergies    Patient has no known allergies.    Review of Systems   Review of Systems  Physical Exam Updated Vital Signs BP 118/75 (BP Location: Right Arm)   Pulse 86   Temp 98.1 F (36.7 C) (Oral)   Resp 16   Ht 5\' 11"  (1.803 m)   Wt 72.6 kg   SpO2 99%   BMI 22.32 kg/m  Physical Exam Vitals and nursing note reviewed.  Constitutional:      General: He is not in acute distress.    Appearance: He is well-developed.  HENT:     Head: Normocephalic and atraumatic.  Eyes:     Conjunctiva/sclera: Conjunctivae normal.  Cardiovascular:     Rate and Rhythm: Normal rate and regular rhythm.     Heart sounds: No murmur heard. Pulmonary:     Effort: Pulmonary effort is normal. No respiratory  distress.     Breath sounds: Normal breath sounds.  Abdominal:     Palpations: Abdomen is soft.     Tenderness: There is no abdominal tenderness.  Musculoskeletal:        General: No swelling.     Left hand: Normal.     Cervical back: Neck supple.     Comments: Mild swelling right hand, patient can make thumbs up, okay and cross his fingers.  Mild tenderness over the thenar eminence, no bony tenderness.  Normal sensation to light touch  Skin:    General: Skin is warm and dry.     Capillary Refill: Capillary refill takes less than 2 seconds.     Comments: Small puncture to right thenar eminence and dorsum of the right hand over the area of the first metacarpal.  Minimal bleeding  Neurological:     General: No focal deficit present.     Mental Status: He is alert and oriented to person, place, and time.  Psychiatric:        Mood and Affect: Mood normal.     ED Results / Procedures / Treatments   Labs (all labs ordered are listed, but only abnormal results are displayed) Labs Reviewed -  No data to display  EKG None  Radiology No results found.  Procedures Procedures    Medications Ordered in ED Medications  ibuprofen (ADVIL) tablet 600 mg (has no administration in time range)  amoxicillin-clavulanate (AUGMENTIN) 875-125 MG per tablet 1 tablet (has no administration in time range)    ED Course/ Medical Decision Making/ A&P                             Medical Decision Making Differential diagnosis: Puncture wound, laceration, cellulitis, foreign body, other Patient has 2 superficial puncture wounds to the right hand no bony tenderness normal range of motion and no neurovascular compromise.  These were copiously irrigated with saline, patient also washed hands thoroughly with soap and water.  Tetanus up-to-date, dog was up-to-date on vaccines.  Considered x-ray but patient has no significant swelling, no bony tenderness, no concern for foreign body so this was deferred at  this time.  He has been on prophylactic antibiotics and wound was dressed with bacitracin and gauze by me, advised on wound check in 2 days follow-up and return precautions.  Risk Prescription drug management.           Final Clinical Impression(s) / ED Diagnoses Final diagnoses:  Dog bite of right hand, initial encounter    Rx / DC Orders ED Discharge Orders          Ordered    amoxicillin-clavulanate (AUGMENTIN) 875-125 MG tablet  Every 12 hours        01/22/23 1908              Adela Esteban A, PA-C 01/22/23 [place], Dan, DO 01/22/23 1937

## 2023-01-22 NOTE — Discharge Instructions (Signed)
You are treated today for a dog bite to your right hand.  You had a tetanus shot 3 years ago also do not need a tetanus shot today.  We are starting on antibiotics to help prevent infection.  Make sure you keep the wound clean and dry.  Wound recheck with your primary care doctor in a few days.  If you get redness, swelling, increased pain, fever, drainage or other worsening symptoms come back to the ER right away.

## 2023-01-22 NOTE — ED Triage Notes (Signed)
Patient arrived POV due to dog bite to right hand. Patient states it happened at 4:45. 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 Patient rates pain 6/10. Appears to swelling to affected areas. Patient A&Ox4.

## 2023-02-21 NOTE — Unmapped (Signed)
[facility] Special[facility] Pharmacy Refill Coordination Note    Specialty Medication(s) to be Shipped:   Infectious Disease: Biktarvy    Other medication(s) to be shipped: No additional medications requested for fill at this time     Alec Hines, DOB: [birth year]  Phone: [phone] (home)       All above HIPAA information was verified with patient.     Was a Nurse, learning disability used for this call? No    Completed refill call assessment today to schedule patient's medication shipment from the [facility] [phone]).  All relevant notes have been reviewed.     Specialty medication(s) and dose(s) confirmed: Regimen is correct and unchanged.   Changes to medications: Alec Hines reports no changes at this time.  Changes to insurance: No  New side effects reported not previously addressed with a pharmacist or physician: None reported  Questions for the pharmacist: No    Confirmed patient received a Conservation officer, historic buildings and a Surveyor, mining with first shipment. The patient will receive a drug information handout for each medication shipped and additional FDA Medication Guides as required.       DISEASE/MEDICATION-SPECIFIC INFORMATION        N/A    SPECIALTY MEDICATION ADHERENCE     Medication Adherence    Patient reported X missed doses in the last month: 2  Specialty Medication: biktarvy 50-200-25mg   Patient is on additional specialty medications: No  Patient is on more than two specialty medications: No  Any gaps in refill history greater than 2 weeks in the last 3 months: no  Demonstrates understanding of importance of adherence: yes  Informant: patient  Confirmed plan for next specialty medication refill: delivery by pharmacy  Refills needed for supportive medications: not needed          Refill Coordination    Has the Patients' Contact Information Changed: No  Is the Shipping Address Different: No         Were doses missed due to medication being on hold? No    BIKTARVY 50-200-25   mg: 8 days of medicine on hand       REFERRAL TO PHARMACIST     Referral to the pharmacist: Not needed      [facility]     Shipping address confirmed in Epic.       Delivery Scheduled: Yes, Expected medication delivery date: 02/24/2023.     Medication will be delivered via UPS to the prescription address in Epic WAM.    Alec Hines   [facility] Specialty Technician

## 2023-02-23 MED FILL — BIKTARVY 50 MG-200 MG-25 MG TABLET: ORAL | 30 days supply | Qty: 30 | Fill #2

## 2023-03-24 NOTE — Unmapped (Signed)
[facility] Special[facility] Pharm[facility] Refill Coordination Note    Specialty Medication(s) to be Shipped:   BIKTARVY 50-200-25 mg tablet (bictegrav-emtricit-tenofov ala)    Other medication(s) to be shipped: No additional medications requested for fill at this time     Alec Hines, DOB: 2001/06/22  Phone: [phone] (home)       All above HIPAA information was verified with patient.     Was a Nurse, learning disability used for this call? No    Completed refill call assessment today to schedule patient's medication shipment from the [facility] [phone]).  All relevant notes have been reviewed.     Specialty medication(s) and dose(s) confirmed: Regimen is correct and unchanged.   Changes to medications: [name] reports no changes at this time.  Changes to insurance: No  New side effects reported not previously addressed with a pharmacist or physician: None reported  Questions for the pharmacist: No    Confirmed patient received a Conservation officer, historic buildings and a Surveyor, mining with first shipment. The patient will receive a drug information handout for each medication shipped and additional FDA Medication Guides as required.       DISEASE/MEDICATION-SPECIFIC INFORMATION        N/A    SPECIALTY MEDICATION ADHERENCE     Medication Adherence    Patient reported X missed doses in the last month: 0  Specialty Medication: BIKTARVY 50-200-25 mg tablet (bictegrav-emtricit-tenofov ala)  Patient is on additional specialty medications: No              Were doses missed due to medication being on hold? No     BIKTARVY 50-200-25 mg tablet (bictegrav-emtricit-tenofov ala): 8 or 9 days of medicine on hand       REFERRAL TO PHARMACIST     Referral to the pharmacist: Not needed      [facility]     Shipping address confirmed in Epic.       Delivery Scheduled: Yes, Expected medication delivery date: 03/30/23.     Medication will be delivered via Same Day Courier to the prescription address in Epic WAM.    Malyk Girouard Artelia Laroche [facility] Specialty Technician

## 2023-03-30 MED FILL — BIKTARVY 50 MG-200 MG-25 MG TABLET: ORAL | 30 days supply | Qty: 30 | Fill #3

## 2023-05-23 NOTE — Unmapped (Signed)
[facility] Special[facility] Pharmacy Refill Coordination Note    Specialty Medication(s) to be Shipped:   Infectious Disease: Biktarvy    Other medication(s) to be shipped: No additional medications requested for fill at this time     Alec Hines, DOB: [birth year]  Phone: [phone] (home)       All above HIPAA information was verified with patient.     Was a Nurse, learning disability used for this call? No    Completed refill call assessment today to schedule patient's medication shipment from the [facility] [phone]).  All relevant notes have been reviewed.     Specialty medication(s) and dose(s) confirmed: Regimen is correct and unchanged.   Changes to medications: Alec Hines reports no changes at this time.  Changes to insurance: No  New side effects reported not previously addressed with a pharmacist or physician: None reported  Questions for the pharmacist: No    Confirmed patient received a Conservation officer, historic buildings and a Surveyor, mining with first shipment. The patient will receive a drug information handout for each medication shipped and additional FDA Medication Guides as required.       DISEASE/MEDICATION-SPECIFIC INFORMATION        N/A    SPECIALTY MEDICATION ADHERENCE     Medication Adherence    Patient reported X missed doses in the last month: 0  Specialty Medication: BIKTARVY 50-200-25 mg tablet (bictegrav-emtricit-tenofov ala)  Patient is on additional specialty medications: No              Were doses missed due to medication being on hold? No    Biktarvy 50-200-25 mg: 2 days of medicine on hand     REFERRAL TO PHARMACIST     Referral to the pharmacist: Not needed      [facility]     Shipping address confirmed in Epic.       Delivery Scheduled: Yes, Expected medication delivery date: 05/24/23.     Medication will be delivered via Same Day Courier to the prescription address in Epic WAM.    Alec Hines   [facility] Specialty Technician

## 2023-05-24 MED FILL — BIKTARVY 50 MG-200 MG-25 MG TABLET: ORAL | 30 days supply | Qty: 30 | Fill #4

## 2023-06-18 NOTE — Unmapped (Signed)
[facility] Pharm[facility] Clinical Assessment & Refill Coordination Note    Alec Hines, [place]: 09-15-2001  Phone: [phone] (home)     All above HIPAA information was verified with patient.     Was a Nurse, learning disability used for this call? No    Specialty Medication(s):   Infectious Disease: Biktarvy     Current Outpatient Medications   Medication Sig Dispense Refill    bictegrav-emtricit-tenofov ala (BIKTARVY) 50-200-25 mg tablet Take 1 tablet by mouth daily. 30 tablet 11    chlorhexidine (HIBICLENS) 4 % external liquid Apply topically once a week. For the next 6 weeks. Apply to groin area and buttocks. Allow to dry for 1 hour, then shower 946 mL 0     No current facility-administered medications for this visit.        Changes to medications: Alec Hines reports no changes at this time.    No Known Allergies    Changes to allergies: No    SPECIALTY MEDICATION ADHERENCE     Biktarvy 50-200-25 mg: approximately 7 days of medicine on hand     Medication Adherence    Patient reported X missed doses in the last month: 0  Specialty Medication: BIKTARVY 50-200-25 mg tablet (bictegrav-emtricit-tenofov ala)  Patient is on additional specialty medications: No  Any gaps in refill history greater than 2 weeks in the last 3 months: no  Demonstrates understanding of importance of adherence: yes  Informant: patient  Provider-estimated medication adherence level: good  Patient is at risk for Non-Adherence: No          Specialty medication(s) dose(s) confirmed: Regimen is correct and unchanged.     Are there any concerns with adherence? No    Adherence counseling provided? Not needed    CLINICAL MANAGEMENT AND INTERVENTION      Clinical Benefit Assessment:    Do you feel the medicine is effective or helping your condition? Yes    HIV ASSOCIATED LABS:     Lab Results   Component Value Date/Time    HIVRS Not Detected 06/07/2022 12:06 PM    HIVRS Detected (A) 06/10/2021 11:08 AM    HIVRS Not Detected 03/10/2021 12:03 PM    HIVCP 60 (H) 06/10/2021 11:08 AM    HIVCP 32,305 (H) 10/16/2020 12:27 PM    HIVCP 160,599 (H) 03/05/2020 03:03 PM    ACD4 984 06/07/2022 12:06 PM    ACD4 780 06/10/2021 11:08 AM    ACD4 513 03/10/2021 12:03 PM       Clinical Benefit counseling provided? Labs from 06/07/22 show evidence of clinical benefit    Adverse Effects Assessment:    Are you experiencing any side effects? No    Are you experiencing difficulty administering your medicine? No    Quality of Life Assessment:    How many days over the past month did your HIV  keep you from your normal activities? For example, brushing your teeth or getting up in the morning. 0    Have you discussed this with your provider? Not needed    Acute Infection Status:    Acute infections noted within Epic:  No active infections  Patient reported infection: None    Therapy Appropriateness:    Is therapy appropriate based on current medication list, adverse reactions, adherence, clinical benefit and progress toward achieving therapeutic goals? Yes, therapy is appropriate and should be continued     DISEASE/MEDICATION-SPECIFIC INFORMATION      N/A    HIV: Not Applicable  PATIENT SPECIFIC NEEDS     Does the patient have any physical, cognitive, or cultural barriers? No    Is the patient high risk? No    Did the patient require a clinical intervention? No    Does the patient require physician intervention or other additional services (i.e., nutrition, smoking cessation, social work)? No    SOCIAL DETERMINANTS OF HEALTH     At the [facility], we have learned that life circumstances - like trouble affording food, housing, utilities, or transportation can affect the health of many of our patients.   That is why we wanted to ask: are you currently experiencing any life circumstances that are negatively impacting your health and/or quality of life? Patient declined to answer    Social Determinants of Health     Financial Resource Strain: Not on file   Internet Connectivity: Not on file   Food Insecurity: Not on file   Tobacco Use: Low Risk  (04/14/2023)    Received from [facility]    Patient History     Smoking Tobacco Use: Never     Smokeless Tobacco Use: Never     Passive Exposure: Not on file   Housing/Utilities: Not on file   Alcohol Use: Not At Risk (12/31/2019)    Received from [facility]    AUDIT-C     Frequency of Alcohol Consumption: Never     Average Number of Drinks: Not on file     Frequency of Binge Drinking: Not on file   Transportation Needs: Not on file   Substance Use: Not on file   Health Literacy: Low Risk  (12/12/2021)    Health Literacy     : Never   Physical Activity: Not on file   Interpersonal Safety: Unknown (06/17/2023)    Interpersonal Safety     Unsafe Where You Currently Live: Not on file     Physically Hurt by Anyone: Not on file     Abused by Anyone: Not on file   Stress: Not on file   Intimate Partner Violence: Not on file   Depression: Not at risk (01/20/2021)    Received from [facility] visits prior to 01/01/2023.    PHQ-2     SDOH PHQ2 SCORE: 0   Social Connections: Not on file       Would you be willing to receive help with any of the needs that you have identified today? Not applicable       SHIPPING     Specialty Medication(s) to be Shipped:   Infectious Disease: Biktarvy    Other medication(s) to be shipped: No additional medications requested for fill at this time     Changes to insurance: No    Delivery Scheduled: Yes, Expected medication delivery date: 06/21/23.     Medication will be delivered via Same Day Courier to the confirmed prescription address in [facility].    The patient will receive a drug information handout for each medication shipped and additional FDA Medication Guides as required.  Verified that patient has previously received a Conservation officer, historic buildings and a Surveyor, mining.    The patient or caregiver noted above participated in the development of this care plan and knows that they can request review of or adjustments to the care plan at any time.      All of the patient's questions and concerns have been addressed.    Demarion Pondexter,  PharmD   [facility] Specialty Pharmacist

## 2023-06-21 DIAGNOSIS — B20 Human immunodeficiency virus [HIV] disease: Principal | ICD-10-CM

## 2023-06-21 DIAGNOSIS — Z717 Human immunodeficiency virus [HIV] counseling: Principal | ICD-10-CM

## 2023-06-21 DIAGNOSIS — Z21 Asymptomatic human immunodeficiency virus [HIV] infection status: Principal | ICD-10-CM

## 2023-06-21 NOTE — Unmapped (Signed)
Alec Hines 's biktarvy shipment will be delayed as a result of MAPs seeking manufacture assistance.     I have reached out to the patient  at [phone]  and left a voicemail message.  We will call the patient back to reschedule the delivery upon resolution. We have canceled this work request.

## 2023-07-20 NOTE — Unmapped (Signed)
[facility] Specialty and Home Delivery Pharmacy Refill Coordination Note    Specialty Medication(s) to be Shipped:   Infectious Disease: Biktarvy 50-200-25mg     Other medication(s) to be shipped: No additional medications requested for fill at this time     Alec Hines, DOB: [birth year]  Phone: [phone] (home)       All above HIPAA information was verified with patient.     Was a Nurse, learning disability used for this call? No    Completed refill call assessment today to schedule patient's medication shipment from the [facility] Delivery [facility]  [phone]).  All relevant notes have been reviewed.     Specialty medication(s) and dose(s) confirmed: Regimen is correct and unchanged.   Changes to medications: [name] reports no changes at this time.  Changes to insurance: No  New side effects reported not previously addressed with a pharmacist or physician: None reported  Questions for the pharmacist: No    Confirmed patient received a Conservation officer, historic buildings and a Surveyor, mining with first shipment. The patient will receive a drug information handout for each medication shipped and additional FDA Medication Guides as required.       DISEASE/MEDICATION-SPECIFIC INFORMATION        N/A    SPECIALTY MEDICATION ADHERENCE     Medication Adherence    Patient reported X missed doses in the last month: 0  Specialty Medication: BIKTARVY 50-200-25 mg tablet (bictegrav-emtricit-tenofov ala)  Patient is on additional specialty medications: No  Informant: patient              Were doses missed due to medication being on hold? No    Biktarvy 50-200-25 mg: 1  day  of medicine on hand       REFERRAL TO PHARMACIST     Referral to the pharmacist: Not needed      [facility]     Shipping address confirmed in Epic.       Delivery Scheduled: Yes, Expected medication delivery date: 07/21/23.     Medication will be delivered via Same Day Courier to the prescription address in Epic WAM.    Jasper Loser   [facility] Specialty and Home Delivery Pharmacy  Specialty Technician

## 2023-07-21 MED FILL — BIKTARVY 50 MG-200 MG-25 MG TABLET: ORAL | 30 days supply | Qty: 30 | Fill #5

## 2023-08-18 ENCOUNTER — Emergency Department (HOSPITAL_COMMUNITY)
Admission: EM | Admit: 2023-08-18 | Discharge: 2023-08-18 | Disposition: A | Discharge status: Home / Self Care | Payer: BC Managed Care – PPO | Attending: Emergency Medicine | Admitting: Emergency Medicine

## 2023-08-18 ENCOUNTER — Encounter (HOSPITAL_COMMUNITY): Payer: Self-pay | Admitting: Emergency Medicine

## 2023-08-18 ENCOUNTER — Other Ambulatory Visit: Payer: Self-pay

## 2023-08-18 ENCOUNTER — Emergency Department (HOSPITAL_COMMUNITY): Payer: BC Managed Care – PPO

## 2023-08-18 DIAGNOSIS — M25512 Pain in left shoulder: Secondary | ICD-10-CM | POA: Diagnosis not present

## 2023-08-18 DIAGNOSIS — Y92410 Unspecified street and highway as the place of occurrence of the external cause: Secondary | ICD-10-CM | POA: Diagnosis not present

## 2023-08-18 DIAGNOSIS — R519 Headache, unspecified: Secondary | ICD-10-CM | POA: Insufficient documentation

## 2023-08-18 MED ORDER — CYCLOBENZAPRINE HCL 10 MG PO TABS: 10.0000 mg | ORAL_TABLET | Freq: Two times a day (BID) | ORAL | 0 refills | Status: AC | PRN

## 2023-08-18 MED ORDER — LIDOCAINE 5 % EX PTCH: 1.0000 | MEDICATED_PATCH | CUTANEOUS | 0 refills | Status: AC
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
## 2023-08-19 NOTE — Unmapped (Signed)
[facility] Specialty and Home Delivery Pharmacy Refill Coordination Note    Specialty Medication(s) to be Shipped:   Infectious Disease: Biktarvy    Other medication(s) to be shipped: No additional medications requested for fill at this time     Alec Hines, DOB: February 28, 2001  Phone: [phone] (home)       All above HIPAA information was verified with patient.     Was a Nurse, learning disability used for this call? No    Completed refill call assessment today to schedule patient's medication shipment from the [facility] and Home Delivery Pharmacy  [phone]).  All relevant notes have been reviewed.     Specialty medication(s) and dose(s) confirmed: Regimen is correct and unchanged.   Changes to medications: Khaza reports no changes at this time.  Changes to insurance: No  New side effects reported not previously addressed with a pharmacist or physician: None reported  Questions for the pharmacist: No    Confirmed patient received a Conservation officer, historic buildings and a Surveyor, mining with first shipment. The patient will receive a drug information handout for each medication shipped and additional FDA Medication Guides as required.       DISEASE/MEDICATION-SPECIFIC INFORMATION        N/A    SPECIALTY MEDICATION ADHERENCE     Medication Adherence    Patient reported X missed doses in the last month: 0  Specialty Medication: BIKTARVY 50-200-25 mg tablet (bictegrav-emtricit-tenofov ala)  Patient is on additional specialty medications: No  Patient is on more than two specialty medications: No  Any gaps in refill history greater than 2 weeks in the last 3 months: no  Demonstrates understanding of importance of adherence: yes  Informant: patient  Confirmed plan for next specialty medication refill: delivery by pharmacy  Refills needed for supportive medications: not needed          Refill Coordination    Has the Patients' Contact Information Changed: No  Is the Shipping Address Different: No         Were doses missed due to medication being on hold? No    BIKTARVY 50-200-25   mg: 10 days of medicine on hand       REFERRAL TO PHARMACIST     Referral to the pharmacist: Not needed      [facility]     Shipping address confirmed in Epic.       Delivery Scheduled: Yes, Expected medication delivery date: 08/25/23.     Medication will be delivered via UPS to the prescription address in Epic WAM.    [name]   [facility] Specialty and Home Delivery Pharmacy  Specialty Technician

## 2023-08-22 NOTE — Plan of Care (Signed)
[facility] Tonsillectomy/Adenoidectomy, Postoperative PEDS care plan entered in error.

## 2023-08-24 MED FILL — BIKTARVY 50 MG-200 MG-25 MG TABLET: ORAL | 30 days supply | Qty: 30 | Fill #6

## 2023-09-04 ENCOUNTER — Emergency Department (HOSPITAL_COMMUNITY)
Admission: EM | Admit: 2023-09-04 | Discharge: 2023-09-05 | Disposition: A | Discharge status: Home / Self Care | Payer: Worker's Compensation | Attending: Emergency Medicine | Admitting: Emergency Medicine

## 2023-09-04 ENCOUNTER — Other Ambulatory Visit: Payer: Self-pay

## 2023-09-04 ENCOUNTER — Encounter (HOSPITAL_COMMUNITY): Payer: Self-pay | Admitting: *Deleted

## 2023-09-04 DIAGNOSIS — S51852A Open bite of left forearm, initial encounter: Secondary | ICD-10-CM | POA: Insufficient documentation

## 2023-09-04 DIAGNOSIS — W540XXA Bitten by dog, initial encounter: Secondary | ICD-10-CM | POA: Diagnosis not present

## 2023-09-04 DIAGNOSIS — Y990 Civilian activity done for income or pay: Secondary | ICD-10-CM | POA: Insufficient documentation

## 2023-09-04 DIAGNOSIS — Y93K3 Activity, grooming and shearing an animal: Secondary | ICD-10-CM | POA: Diagnosis not present

## 2023-09-04 DIAGNOSIS — Z23 Encounter for immunization: Secondary | ICD-10-CM | POA: Diagnosis not present

## 2023-09-04 NOTE — ED Triage Notes (Signed)
Dog bite to the lt forearm x 2 small bitten by a dog while the dog was being groomed  he went to urgent care and they sent him some med to the pharmacy but the pt did not get the med  filled

## 2023-09-05 MED ORDER — NAPROXEN 500 MG PO TABS: 500.0000 mg | ORAL_TABLET | Freq: Two times a day (BID) | ORAL | 0 refills | Status: AC

## 2023-09-05 MED ORDER — AMOXICILLIN-POT CLAVULANATE 875-125 MG PO TABS: 1.0000 | ORAL_TABLET | Freq: Two times a day (BID) | ORAL | 0 refills | Status: AC

## 2023-09-05 MED ORDER — TETANUS-DIPHTH-ACELL PERTUSSIS 5-2.5-18.5 LF-MCG/0.5 IM SUSY
0.5000 mL | PREFILLED_SYRINGE | Freq: Once | INTRAMUSCULAR | Status: AC
  Administered 2023-09-05: 0.5 mL via INTRAMUSCULAR
  Filled 2023-09-05: qty 0.5

## 2023-09-05 NOTE — Discharge Instructions (Addendum)
Please take the medications as directed.  Follow-up with animal control for updates on the dog status.  If the dog develops signs/symptoms of rabies you will need rabies vaccine and immunoglobulin.  Take the prescribed antibiotics until complete.  You may use the prescribed Naprosyn for inflammation up to twice daily.  Ice the affected extremity and keep the wound clean.  If you develop any emergent symptoms return to the emergency department.

## 2023-09-05 NOTE — ED Provider Notes (Signed)
[place] EMERGENCY DEPARTMENT AT [facility] Provider Note   CSN: [id number] Arrival date & time: 09/04/23  2328     History  Chief Complaint  Patient presents with   Animal Bite    Alan Joseph is a 22 y.o. male.  Patient presents to the emergency department complaining of a dog bite to the posterior left forearm.  Patient works at a [employer].  He states that the dog was being roomed when he decided to bite.  He was evaluated earlier today at an urgent care and prescribed antibiotics but has been unable to pick up the antibiotics.  He presents here tonight concerned about possible need for a tetanus vaccination and for further evaluation.  The patient states that animal control was notified by work and that the patient is in custody of the owner.  The dog was reportedly vaccinated on October 31 for rabies.  The patient complains of pain/swelling at the bite site but has normal range of motion of hand/wrist/fingers.  Past medical history noncontributory.   Animal Bite      Home Medications Prior to Admission medications   Medication Sig Start Date End Date Taking? Authorizing Provider  amoxicillin-clavulanate (AUGMENTIN) 875-125 MG tablet Take 1 tablet by mouth every 12 (twelve) hours. 09/05/23  Yes Darrick Grinder, PA-C  naproxen (NAPROSYN) 500 MG tablet Take 1 tablet (500 mg total) by mouth 2 (two) times daily. 09/05/23  Yes Darrick Grinder, PA-C  cyclobenzaprine (FLEXERIL) 10 MG tablet Take 1 tablet (10 mg total) by mouth 2 (two) times daily as needed for muscle spasms. 08/18/23   Netta Corrigan, PA-C  ibuprofen (ADVIL) 800 MG tablet Take 1 tablet (800 mg total) by mouth every 8 (eight) hours as needed for up to 30 doses. 06/28/21   Terald Sleeper, MD  lidocaine (LIDODERM) 5 % Place 1 patch onto the skin daily. Remove & Discard patch within 12 hours or as directed by MD 08/18/23   Netta Corrigan, PA-C      Allergies    Patient has no  known allergies.    Review of Systems   Review of Systems  Physical Exam Updated Vital Signs BP 122/63 (BP Location: Right Arm)   Pulse 71   Temp 98.5 F (36.9 C)   Resp 18   Ht 5\' 11"  (1.803 m)   Wt 72.6 kg   SpO2 100%   BMI 22.32 kg/m  Physical Exam Vitals and nursing note reviewed.  HENT:     Head: Normocephalic and atraumatic.  Eyes:     Pupils: Pupils are equal, round, and reactive to light.  Pulmonary:     Effort: Pulmonary effort is normal. No respiratory distress.  Musculoskeletal:        General: No signs of injury. Normal range of motion.     Cervical back: Normal range of motion.  Skin:    General: Skin is dry.     Comments: Puncture wound on posterior left forearm, closed with Steri-Strip.  No drainage or bleeding noted.  No surrounding erythema.  Mild swelling noted.  Neurological:     Mental Status: He is alert.  Psychiatric:        Speech: Speech normal.        Behavior: Behavior normal.     ED Results / Procedures / Treatments   Labs (all labs ordered are listed, but only abnormal results are displayed) Labs Reviewed - No data to display  EKG None  Radiology No results found.  Procedures Procedures    Medications Ordered in ED Medications  Tdap (BOOSTRIX) injection 0.5 mL (0.5 mLs Intramuscular Given 09/05/23 0116)    ED Course/ Medical Decision Making/ A&P                                 Medical Decision Making  Patient presents to the emergency room with a chief complaint of dog bite.  Differential includes fracture, puncture wound, tendon injury, infection, others  Physical examination reveals a small puncture wound.  No apparent tendon damage appreciated.  Patient with full range of motion of the wrist, hand, fingers.  Brisk cap refill appreciated.  No sensation deficits noted.  Patient is unsure of when he had childhood tetanus vaccinations and would like a tetanus booster which is reasonable.  Tdap ordered and administered.   Patient will need antibiotic treatment for the wound.  Will send new prescription for Augmentin to patient's pharmacy.  Patient advised to keep wound clean and to ice the area for inflammation.  Patient also prescribed Naprosyn.  Patient advised to keep in touch with animal control for updates on dog condition.  No indication for rabies vaccination/immunoglobulin at this time.  Discharge home with return precautions.        Final Clinical Impression(s) / ED Diagnoses Final diagnoses:  Dog bite, initial encounter    Rx / DC Orders ED Discharge Orders          Ordered    amoxicillin-clavulanate (AUGMENTIN) 875-125 MG tablet  Every 12 hours        09/05/23 0111    naproxen (NAPROSYN) 500 MG tablet  2 times daily        09/05/23 0111              Pamala Duffel 09/05/23 0135    Tilden Fossa, MD 09/05/23 [phone]

## 2023-09-15 NOTE — Unmapped (Signed)
[name] Specialty and Home Delivery Pharmacy Refill Coordination Note    Specialty Medication(s) to be Shipped:   Infectious Disease: Biktarvy    Other medication(s) to be shipped: No additional medications requested for fill at this time     Alec Hines, DOB: 11-16-2000  Phone: [phone] (home)       All above HIPAA information was verified with patient.     Was a Nurse, learning disability used for this call? No    Completed refill call assessment today to schedule patient's medication shipment from the [facility] and Home Delivery Pharmacy  [phone]).  All relevant notes have been reviewed.     Specialty medication(s) and dose(s) confirmed: Regimen is correct and unchanged.   Changes to medications: Alec Hines reports no changes at this time.  Changes to insurance: No  New side effects reported not previously addressed with a pharmacist or physician: None reported  Questions for the pharmacist: No    Confirmed patient received a Conservation officer, historic buildings and a Surveyor, mining with first shipment. The patient will receive a drug information handout for each medication shipped and additional FDA Medication Guides as required.       DISEASE/MEDICATION-SPECIFIC INFORMATION        N/A    SPECIALTY MEDICATION ADHERENCE     Medication Adherence    Patient reported X missed doses in the last month: 1  Specialty Medication: BIKTARVY 50-200-25 mg tablet (bictegrav-emtricit-tenofov ala)  Patient is on additional specialty medications: No  Patient is on more than two specialty medications: No  Any gaps in refill history greater than 2 weeks in the last 3 months: no  Demonstrates understanding of importance of adherence: yes  Informant: patient  Confirmed plan for next specialty medication refill: delivery by pharmacy  Refills needed for supportive medications: not needed          Refill Coordination    Has the Patients' Contact Information Changed: No  Is the Shipping Address Different: No         Were doses missed due to medication being on hold? No    BIKTARVY 50-200-25   mg: 6 days of medicine on hand       REFERRAL TO PHARMACIST     Referral to the pharmacist: Not needed      [facility]     Shipping address confirmed in Epic.       Delivery Scheduled: Yes, Expected medication delivery date: 09/21/23.     Medication will be delivered via UPS to the prescription address in Epic WAM.    [name]   [facility] Specialty and Home Delivery Pharmacy  Specialty Technician

## 2023-10-12 NOTE — Unmapped (Signed)
[facility] Specialty and Home Delivery Pharmacy Refill Coordination Note    Specialty Medication(s) to be Shipped:   Infectious Disease: Biktarvy    Other medication(s) to be shipped: No additional medications requested for fill at this time     Alec Hines, DOB: 11/08/2000  Phone: [phone] (home)       All above HIPAA information was verified with patient.     Was a Nurse, learning disability used for this call? No    Completed refill call assessment today to schedule patient's medication shipment from the [facility] and Home Delivery Pharmacy  [phone]).  All relevant notes have been reviewed.     Specialty medication(s) and dose(s) confirmed: Regimen is correct and unchanged.   Changes to medications: Mischa reports no changes at this time.  Changes to insurance: No  New side effects reported not previously addressed with a pharmacist or physician: None reported  Questions for the pharmacist: No    Confirmed patient received a Conservation officer, historic buildings and a Surveyor, mining with first shipment. The patient will receive a drug information handout for each medication shipped and additional FDA Medication Guides as required.       DISEASE/MEDICATION-SPECIFIC INFORMATION        N/A    SPECIALTY MEDICATION ADHERENCE     Medication Adherence    Patient reported X missed doses in the last month: 0  Specialty Medication: BIKTARVY 50-200-25 mg tablet (bictegrav-emtricit-tenofov ala)  Patient is on additional specialty medications: No  Patient is on more than two specialty medications: No  Any gaps in refill history greater than 2 weeks in the last 3 months: no  Demonstrates understanding of importance of adherence: yes  Informant: patient  Confirmed plan for next specialty medication refill: delivery by pharmacy  Refills needed for supportive medications: not needed          Refill Coordination    Has the Patients' Contact Information Changed: No  Is the Shipping Address Different: No         Were doses missed due to medication being on hold? No    BIKTARVY 50-200-25   mg: 2 days of medicine on hand       REFERRAL TO PHARMACIST     Referral to the pharmacist: Not needed      [facility]     Shipping address confirmed in Epic.       Delivery Scheduled: Yes, Expected medication delivery date: 10/14/23.     Medication will be delivered via UPS to the prescription address in Epic WAM.    [name]   [facility] Specialty and Home Delivery Pharmacy  Specialty Technician

## 2023-10-13 MED FILL — BIKTARVY 50 MG-200 MG-25 MG TABLET: ORAL | 30 days supply | Qty: 30 | Fill #7

## 2023-11-11 DIAGNOSIS — B20 Human immunodeficiency virus [HIV] disease: Principal | ICD-10-CM

## 2023-11-11 DIAGNOSIS — Z717 Human immunodeficiency virus [HIV] counseling: Principal | ICD-10-CM

## 2023-11-11 DIAGNOSIS — Z21 Asymptomatic human immunodeficiency virus [HIV] infection status: Principal | ICD-10-CM

## 2023-11-11 MED ORDER — BIKTARVY 50 MG-200 MG-25 MG TABLET
ORAL_TABLET | Freq: Every day | ORAL | 11 refills | 30.00 days | Status: CP
Start: 2023-11-11 — End: 2024-11-10
  Filled 2023-11-22: qty 30, 30d supply, fill #0

## 2023-11-17 NOTE — Unmapped (Signed)
[facility] Specialty and Home Delivery Pharmacy Refill Coordination Note    Specialty Medication(s) to be Shipped:   Infectious Disease: Biktarvy    Other medication(s) to be shipped: No additional medications requested for fill at this time     Alec Hines, DOB: February 06, 2001  Phone: [phone] (home)       All above HIPAA information was verified with patient.     Was a Nurse, learning disability used for this call? No    Completed refill call assessment today to schedule patient's medication shipment from the [facility] and Home Delivery Pharmacy  [phone]).  All relevant notes have been reviewed.     Specialty medication(s) and dose(s) confirmed: Regimen is correct and unchanged.   Changes to medications: Milus reports no changes at this time.  Changes to insurance: No  New side effects reported not previously addressed with a pharmacist or physician: None reported  Questions for the pharmacist: No    Confirmed patient received a Conservation officer, historic buildings and a Surveyor, mining with first shipment. The patient will receive a drug information handout for each medication shipped and additional FDA Medication Guides as required.       DISEASE/MEDICATION-SPECIFIC INFORMATION        N/A    SPECIALTY MEDICATION ADHERENCE     Medication Adherence    Patient reported X missed doses in the last month: 0  Specialty Medication: BIKTARVY 50-200-25 mg tablet (bictegrav-emtricit-tenofov ala)  Patient is on additional specialty medications: No  Patient is on more than two specialty medications: No  Any gaps in refill history greater than 2 weeks in the last 3 months: no  Demonstrates understanding of importance of adherence: yes  Informant: patient  Confirmed plan for next specialty medication refill: delivery by pharmacy  Refills needed for supportive medications: not needed          Refill Coordination    Has the Patients' Contact Information Changed: No  Is the Shipping Address Different: No         Were doses missed due to medication being on hold? No    BIKTARVY 50-200-25   mg: 5 days of medicine on hand       REFERRAL TO PHARMACIST     Referral to the pharmacist: Not needed      [facility]     Shipping address confirmed in Epic.       Delivery Scheduled: Yes, Expected medication delivery date: 11/23/23.     Medication will be delivered via UPS to the prescription address in Epic WAM.    [name]   [facility] Specialty and Home Delivery Pharmacy  Specialty Technician

## 2023-12-28 NOTE — Unmapped (Signed)
 [facility] Specialty and Home Delivery Pharmacy Refill Coordination Note    Specialty Medication(s) to be Shipped:   Infectious Disease: Biktarvy    Other medication(s) to be shipped: No additional medications requested for fill at this time     Alec Hines, DOB: 2001/03/15  Phone: [phone] (home)       All above HIPAA information was verified with patient.     Was a Nurse, learning disability used for this call? No    Completed refill call assessment today to schedule patient's medication shipment from the [facility] and [facility] Delivery Pharmacy  [phone]).  All relevant notes have been reviewed.     Specialty medication(s) and dose(s) confirmed: Regimen is correct and unchanged.   Changes to medications: [name] reports no changes at this time.  Changes to insurance: No  New side effects reported not previously addressed with a pharmacist or physician: None reported  Questions for the pharmacist: No    Confirmed patient received a Conservation officer, historic buildings and a Surveyor, mining with first shipment. The patient will receive a drug information handout for each medication shipped and additional FDA Medication Guides as required.       DISEASE/MEDICATION-SPECIFIC INFORMATION        N/A    SPECIALTY MEDICATION ADHERENCE     Medication Adherence    Patient reported X missed doses in the last month: 0  Specialty Medication: BIKTARVY 50-200-25 mg tablet (bictegrav-emtricit-tenofov ala)  Patient is on additional specialty medications: No  Patient is on more than two specialty medications: No  Any gaps in refill history greater than 2 weeks in the last 3 months: no  Demonstrates understanding of importance of adherence: yes  Informant: patient  Confirmed plan for next specialty medication refill: delivery by pharmacy  Refills needed for supportive medications: not needed          Refill Coordination    Has the Patients' Contact Information Changed: No  Is the Shipping Address Different: No         Were doses missed due to medication being on hold? No    BIKTARVY 50-200-25   mg: 5 days of medicine on hand       REFERRAL TO PHARMACIST     Referral to the pharmacist: Not needed      [facility]     Shipping address confirmed in Epic.       Delivery Scheduled: Yes, Expected medication delivery date: 01/02/24.     Medication will be delivered via UPS to the prescription address in Epic WAM.    [name]   [facility] Specialty and Home Delivery Pharmacy  Specialty Technician

## 2023-12-30 MED FILL — BIKTARVY 50 MG-200 MG-25 MG TABLET: ORAL | 30 days supply | Qty: 30 | Fill #1

## 2024-01-26 NOTE — Unmapped (Addendum)
 [facility] Specialty and Home Delivery Pharmacy Refill Coordination Note    Specialty Medication(s) to be Shipped:   Infectious Disease: Biktarvy    Other medication(s) to be shipped: No additional medications requested for fill at this time     Alec Hines, DOB: 03/26/2001  Phone: [phone] (home)       All above HIPAA information was verified with patient.     Was a Nurse, learning disability used for this call? No    Completed refill call assessment today to schedule patient's medication shipment from the [facility] and Home Delivery Pharmacy  [phone]).  All relevant notes have been reviewed.     Specialty medication(s) and dose(s) confirmed: Regimen is correct and unchanged.   Changes to medications: Avelardo reports no changes at this time.  Changes to insurance: No  New side effects reported not previously addressed with a pharmacist or physician: None reported  Questions for the pharmacist: No    Confirmed patient received a Conservation officer, historic buildings and a Surveyor, mining with first shipment. The patient will receive a drug information handout for each medication shipped and additional FDA Medication Guides as required.       DISEASE/MEDICATION-SPECIFIC INFORMATION        N/A    SPECIALTY MEDICATION ADHERENCE     Medication Adherence    Patient reported X missed doses in the last month: 0  Specialty Medication: bictegrav-emtricit-tenofov ala: BIKTARVY 50-200-25 mg tablet  Patient is on additional specialty medications: No              Were doses missed due to medication being on hold? No    bictegrav-emtricit-tenofov ala: BIKTARVY 50-200-25 mg tablet: 6-7 days of medicine on hand       REFERRAL TO PHARMACIST     Referral to the pharmacist: Not needed      [facility]     Shipping address confirmed in Epic.     Cost and Payment: Patient has a $0 copay, payment information is not required.    Delivery Scheduled: Yes, Expected medication delivery date: 02/01/2024.     Medication will be delivered via UPS to the prescription address in Epic WAM.    Delaine Lame   [facility] Specialty and Home Delivery Pharmacy  Specialty Technician

## 2024-01-31 MED FILL — BIKTARVY 50 MG-200 MG-25 MG TABLET: ORAL | 30 days supply | Qty: 30 | Fill #2

## 2024-02-22 NOTE — Unmapped (Signed)
 [facility] Specialty and Home Delivery Pharmacy Refill Coordination Note    Specialty Medication(s) to be Shipped:   Infectious Disease: Biktarvy     Other medication(s) to be shipped: No additional medications requested for fill at this time     Alec Hines, DOB: Aug 14, 2001  Phone: [phone] (home)       All above HIPAA information was verified with patient.     Was a Nurse, learning disability used for this call? No    Completed refill call assessment today to schedule patient's medication shipment from the [facility] and Home Delivery Pharmacy  [phone]).  All relevant notes have been reviewed.     Specialty medication(s) and dose(s) confirmed: Regimen is correct and unchanged.   Changes to medications: Godson reports no changes at this time.  Changes to insurance: No  New side effects reported not previously addressed with a pharmacist or physician: None reported  Questions for the pharmacist: No    Confirmed patient received a Conservation officer, historic buildings and a Surveyor, mining with first shipment. The patient will receive a drug information handout for each medication shipped and additional FDA Medication Guides as required.       DISEASE/MEDICATION-SPECIFIC INFORMATION        N/A    SPECIALTY MEDICATION ADHERENCE     Medication Adherence    Patient reported X missed doses in the last month: 0  Specialty Medication: BIKTARVY  50-200-25 mg tablet (bictegrav-emtricit-tenofov ala)  Patient is on additional specialty medications: No              Were doses missed due to medication being on hold? No    BIKTARVY  50-200-25 mg tablet (bictegrav-emtricit-tenofov ala): 7-8 days of medicine on hand       REFERRAL TO PHARMACIST     Referral to the pharmacist: Not needed      [facility]     Shipping address confirmed in Epic.     Cost and Payment: Patient has a $0 copay, payment information is not required.    Delivery Scheduled: Yes, Expected medication delivery date: 02/28/2024.     Medication will be delivered via UPS to the prescription address in Epic WAM.    [name]   [facility] Specialty and Home Delivery Pharmacy  Specialty Technician

## 2024-02-27 MED FILL — BIKTARVY 50 MG-200 MG-25 MG TABLET: ORAL | 30 days supply | Qty: 30 | Fill #3

## 2024-03-30 NOTE — Unmapped (Signed)
 [facility] Refill Coordination Note    Specialty Medication(s) to be Shipped:   Infectious Disease: Biktarvy    Other medication(s) to be shipped: No additional medications requested for fill at this time     Alec Hines, DOB: September 10, 2001  Phone: [phone] (home)       All above HIPAA information was verified with patient.     Was a Nurse, learning disability used for this call? No    Completed refill call assessment today to schedule patient's medication shipment from the [facility] and Home Delivery Pharmacy  [phone]).  All relevant notes have been reviewed.     Specialty medication(s) and dose(s) confirmed: Regimen is correct and unchanged.   Changes to medications: Alec Hines reports no changes at this time.  Changes to insurance: No  New side effects reported not previously addressed with a pharmacist or physician: None reported  Questions for the pharmacist: No    Confirmed patient received a Conservation officer, historic buildings and a Surveyor, mining with first shipment. The patient will receive a drug information handout for each medication shipped and additional FDA Medication Guides as required.       DISEASE/MEDICATION-SPECIFIC INFORMATION        N/A    SPECIALTY MEDICATION ADHERENCE     Medication Adherence    Patient reported X missed doses in the last month: 0  Specialty Medication: BIKTARVY 50-200-25 mg tablet (bictegrav-emtricit-tenofov ala)  Patient is on additional specialty medications: No              Were doses missed due to medication being on hold? No     BIKTARVY 50-200-25 mg tablet (bictegrav-emtricit-tenofov ala): 5 days of medicine on hand       REFERRAL TO PHARMACIST     Referral to the pharmacist: Not needed      [facility]     Shipping address confirmed in Epic.     Cost and Payment: Patient has a $0 copay, payment information is not required.    Delivery Scheduled: Yes, Expected medication delivery date: 04/03/2024.     Medication will be delivered via UPS to the prescription address in Epic WAM.    Alec Hines Plan   [facility]  Specialty Technician

## 2024-04-02 ENCOUNTER — Telehealth: Payer: Self-pay

## 2024-04-02 ENCOUNTER — Other Ambulatory Visit (HOSPITAL_COMMUNITY): Payer: Self-pay

## 2024-04-02 MED FILL — BIKTARVY 50 MG-200 MG-25 MG TABLET: ORAL | 30 days supply | Qty: 30 | Fill #4

## 2024-04-02 NOTE — Telephone Encounter (Signed)
 [facility] Patient Advocate Encounter  Insurance verification completed.    The patient is insured through [facility].     Ran test claim for BIKTARVY The current 30 day co-pay is $0.  Ran test claim for DOVATO The current 30 day co-pay is $0  Ran test claim for [facility] The current 30 day co-pay is $0   We will continue to follow to see if copay assistance is needed.  This test claim was processed through [facility]- copay amounts may vary at other pharmacies due to pharmacy/plan contracts, or as the patient moves through the different stages of their insurance plan.

## 2024-04-16 ENCOUNTER — Ambulatory Visit

## 2024-04-16 ENCOUNTER — Ambulatory Visit: Admitting: Family

## 2024-04-16 ENCOUNTER — Ambulatory Visit: Admitting: Pharmacist

## 2024-04-16 NOTE — Unmapped (Signed)
 PT called for a refill on bivtarvy

## 2024-04-16 NOTE — Unmapped (Signed)
 Patient called back. Patient advised for Mrs. Alec Hines recommendation. Patient states he is unable to come in due to going to camp. He states that he would like to speak with Mrs. Alec Hines and he was made aware that she is not available at this time. Patient made aware of [name] Policy and still insist on speaking Mrs. Alec Hines. He was advised that I will forward to her CMA.

## 2024-04-16 NOTE — Unmapped (Signed)
 Teresa Rock Herald, FNP to Elaine Goodell      04/16/24  1:41 PM  Patient needs a follow up ASAP before biktarvy refill. Last appointment almost 2 years ago.

## 2024-04-25 NOTE — Unmapped (Signed)
 [facility] Specialty and Home Delivery Pharmacy Clinical Assessment & Refill Coordination Note    Alec Hines, DOB: 08-12-2001  Phone: [phone] (home)     All above HIPAA information was verified with patient.     Was a Nurse, learning disability used for this call? No    Specialty Medication(s):   Infectious Disease: Biktarvy      Current Medications[1]     Changes to medications: Ulas reports no changes at this time.    Medication list has been reviewed and updated in Epic: Yes    Allergies[2]    Changes to allergies: No    Allergies have been reviewed and updated in Epic: Yes    SPECIALTY MEDICATION ADHERENCE     Biktarvy  50-200-25 mg: 6-7 days of medicine on hand       Medication Adherence    Patient reported X missed doses in the last month: 3  Specialty Medication: Biktarvy  50-200-25mg  QD  Patient is on additional specialty medications: No  Informant: patient          Specialty medication(s) dose(s) confirmed: Regimen is correct and unchanged.     Are there any concerns with adherence? No    Adherence counseling provided? Not needed -  missed some days while at camp since he forgot medication, but otherwise adherent    CLINICAL MANAGEMENT AND INTERVENTION      Clinical Benefit Assessment:    Do you feel the medicine is effective or helping your condition? Yes    Clinical Benefit counseling provided? Not needed    Adverse Effects Assessment:    Are you experiencing any side effects? No    Are you experiencing difficulty administering your medicine? No    Quality of Life Assessment:    Quality of Life    Rheumatology  Oncology  Dermatology  Cystic Fibrosis          How many days over the past month did your HIV  keep you from your normal activities? For example, brushing your teeth or getting up in the morning. Patient declined to answer    Have you discussed this with your provider? Not needed    Acute Infection Status:    Acute infections noted within Epic:  No active infections    Patient reported infection: None    Therapy Appropriateness:    Is therapy appropriate based on current medication list, adverse reactions, adherence, clinical benefit and progress toward achieving therapeutic goals? Yes, therapy is appropriate and should be continued     Clinical Intervention:    Was an intervention completed as part of this clinical assessment? No    DISEASE/MEDICATION-SPECIFIC INFORMATION      N/A    HIV: Not Applicable    PATIENT SPECIFIC NEEDS     Does the patient have any physical, cognitive, or cultural barriers? No    Is the patient high risk? No    Does the patient require physician intervention or other additional services (i.e., nutrition, smoking cessation, social work)? No    Does the patient have an additional or emergency contact listed in their chart? Yes    SOCIAL DETERMINANTS OF HEALTH     At the [facility], we have learned that life circumstances - like trouble affording food, housing, utilities, or transportation can affect the health of many of our patients.   That is why we wanted to ask: are you currently experiencing any life circumstances that are negatively impacting your health and/or quality of life? Patient declined to answer  Social Drivers of Health     Food Insecurity: Not on file   Tobacco Use: Low Risk  (04/14/2023)    Received from [facility]    Patient History     Smoking Tobacco Use: Never     Smokeless Tobacco Use: Never     Passive Exposure: Not on file   Transportation Needs: Not on file   Alcohol Use: Not At Risk (12/31/2019)    Received from [facility]    AUDIT-C     Frequency of Alcohol Consumption: Never     Average Number of Drinks: Not on file     Frequency of Binge Drinking: Not on file   Housing: Not on file   Physical Activity: Not on file   Utilities: Not on file   Stress: Not on file   Interpersonal Safety: Not on file   Substance Use: Not on file (09/06/2023)   Intimate Partner Violence: Not on file   Social Connections: Not on file Financial Resource Strain: Not on file   Health Literacy: Low Risk  (12/12/2021)    Health Literacy     : Never   Internet Connectivity: Not on file       Would you be willing to receive help with any of the needs that you have identified today? Not applicable       SHIPPING     Specialty Medication(s) to be Shipped:   Infectious Disease: Biktarvy     Other medication(s) to be shipped: No additional medications requested for fill at this time     Changes to insurance: No    Cost and Payment: Patient has a $0 copay, payment information is not required.    Delivery Scheduled: Yes, Expected medication delivery date: 04/27/24.     Medication will be delivered via UPS to the confirmed prescription address in [facility].    The patient will receive a drug information handout for each medication shipped and additional FDA Medication Guides as required.  Verified that patient has previously received a Conservation officer, historic buildings and a Surveyor, mining.    The patient or caregiver noted above participated in the development of this care plan and knows that they can request review of or adjustments to the care plan at any time.      All of the patient's questions and concerns have been addressed.    [name], PharmD   [facility] Specialty and Home Delivery Pharmacy Specialty Pharmacist       [1]   Current Outpatient Medications   Medication Sig Dispense Refill    bictegrav-emtricit-tenofov ala (BIKTARVY ) 50-200-25 mg tablet Take 1 tablet by mouth daily. 30 tablet 11    chlorhexidine  (HIBICLENS ) 4 % external liquid Apply topically once a week. For the next 6 weeks. Apply to groin area and buttocks. Allow to dry for 1 hour, then shower 946 mL 0     No current facility-administered medications for this visit.   [2] No Known Allergies

## 2024-04-26 MED FILL — BIKTARVY 50 MG-200 MG-25 MG TABLET: ORAL | 30 days supply | Qty: 30 | Fill #5

## 2024-05-14 NOTE — Unmapped (Signed)
 [facility] Specialty and Home Delivery Pharmacy Refill Coordination Note    Specialty Medication(s) to be Shipped:   Infectious Disease: Biktarvy     Other medication(s) to be shipped: No additional medications requested for fill at this time     Alec Hines, DOB: 2001-09-24  Phone: [phone] (home)       All above HIPAA information was verified with patient.     Was a Nurse, learning disability used for this call? No    Completed refill call assessment today to schedule patient's medication shipment from the [facility] and Home Delivery Pharmacy  [phone]).  All relevant notes have been reviewed.     Specialty medication(s) and dose(s) confirmed: Regimen is correct and unchanged.   Changes to medications: Marx reports no changes at this time.  Changes to insurance: No  New side effects reported not previously addressed with a pharmacist or physician: None reported  Questions for the pharmacist: No    Confirmed patient received a Conservation officer, historic buildings and a Surveyor, mining with first shipment. The patient will receive a drug information handout for each medication shipped and additional FDA Medication Guides as required.       DISEASE/MEDICATION-SPECIFIC INFORMATION        N/A    SPECIALTY MEDICATION ADHERENCE     Medication Adherence    Patient reported X missed doses in the last month: 0  Specialty Medication: BIKTARVY  50-200-25 mg tablet (bictegrav-emtricit-tenofov ala)  Patient is on additional specialty medications: No              Were doses missed due to medication being on hold? No      BIKTARVY  50-200-25 mg tablet (bictegrav-emtricit-tenofov ala): 4-5 days of medicine on hand       REFERRAL TO PHARMACIST     Referral to the pharmacist: Not needed      [facility]     Shipping address confirmed in Epic.     Cost and Payment: Patient has a $0 copay, payment information is not required.    Delivery Scheduled: Yes, Expected medication delivery date: 05/18/2024.     Medication will be delivered via UPS to the prescription address in Epic WAM.    Lucie CHRISTELLA Forts   [facility] Specialty and Home Delivery Pharmacy  Specialty Technician

## 2024-05-17 NOTE — Unmapped (Signed)
 Alec Hines 's Biktarvy  shipment will be delayed as a result of the medication is too soon to refill until 05/19/24.     I have reached out to the patient  at [phone] and communicated the delay. We will wait for a call back from the patient to reschedule the delivery.  We have not confirmed the new delivery date.

## 2024-05-21 MED FILL — BIKTARVY 50 MG-200 MG-25 MG TABLET: ORAL | 30 days supply | Qty: 30 | Fill #6

## 2024-05-21 NOTE — Unmapped (Signed)
 Alec Hines 's Biktarvy  shipment will be sent out as a result of the medication is no longer refill too soon.      I have reached out to the patient  at [phone] and communicated the delivery change. We will reschedule the medication for the delivery date that the patient agreed upon.  We have confirmed the delivery date as 05/22/24, via ups.

## 2024-05-22 ENCOUNTER — Ambulatory Visit: Admitting: Internal Medicine

## 2024-05-22 ENCOUNTER — Ambulatory Visit

## 2024-05-22 ENCOUNTER — Ambulatory Visit: Admitting: Pharmacist

## 2024-05-29 ENCOUNTER — Ambulatory Visit: Admitting: Pharmacist

## 2024-05-29 ENCOUNTER — Ambulatory Visit

## 2024-05-29 ENCOUNTER — Telehealth: Payer: Self-pay

## 2024-05-29 ENCOUNTER — Ambulatory Visit: Admitting: Infectious Diseases

## 2024-05-29 DIAGNOSIS — Z113 Encounter for screening for infections with a predominantly sexual mode of transmission: Secondary | ICD-10-CM | POA: Insufficient documentation

## 2024-05-29 DIAGNOSIS — B20 Human immunodeficiency virus [HIV] disease: Secondary | ICD-10-CM | POA: Insufficient documentation

## 2024-05-29 DIAGNOSIS — Z0000 Encounter for general adult medical examination without abnormal findings: Secondary | ICD-10-CM | POA: Insufficient documentation

## 2024-05-29 DIAGNOSIS — Z79899 Other long term (current) drug therapy: Secondary | ICD-10-CM | POA: Insufficient documentation

## 2024-05-29 NOTE — Telephone Encounter (Signed)
 Attempted to call patient regarding missed appointment today. He has missed 3 new patient appointment. VM full and not accepting new messages. Left  vm with [facility] ID clarify if they plan on continue pt care or if pt will be coming to [facility] ID. Pt has contacted [facility] for refills on Biktarvy in June and July.  P: [phone] ext [phone]

## 2024-07-13 NOTE — Unmapped (Signed)
 [facility] Refill Coordination Note    Specialty Medication(s) to be Shipped:   Infectious Disease: Biktarvy     Other medication(s) to be shipped: No additional medications requested for fill at this time    Specialty Medications not needed at this time: N/A     Alec Hines, DOB: [birth year]  Phone: [phone] (home)       All above HIPAA information was verified with patient.     Was a Nurse, learning disability used for this call? No    Completed refill call assessment today to schedule patient's medication shipment from the [facility]  [phone]).  All relevant notes have been reviewed.     Specialty medication(s) and dose(s) confirmed: Regimen is correct and unchanged.   Changes to medications: Alec Hines reports no changes at this time.  Changes to insurance: No  New side effects reported not previously addressed with a pharmacist or physician: None reported  Questions for the pharmacist: No    Confirmed patient received a Conservation officer, historic buildings and a Surveyor, mining with first shipment. The patient will receive a drug information handout for each medication shipped and additional FDA Medication Guides as required.       DISEASE/MEDICATION-SPECIFIC INFORMATION        N/A    SPECIALTY MEDICATION ADHERENCE     Medication Adherence    Patient reported X missed doses in the last month: 0  Specialty Medication: BIKTARVY  50-200-25 mg tablet (bictegrav-emtricit-tenofov ala)  Patient is on additional specialty medications: No  Informant: patient              Were doses missed due to medication being on hold? No    Biktarvy  50-200-25 mg: 6 days of medicine on hand       REFERRAL TO PHARMACIST     Referral to the pharmacist: Not needed      [facility]     Shipping address confirmed in Epic.     Cost and Payment: Patient has a $0 copay, payment information is not required.    Delivery Scheduled: Yes, Expected medication delivery date: 07/17/24.     Medication will be delivered via UPS to the prescription address in Epic WAM.    Alec Hines, PharmD   [facility]  Specialty Pharmacist

## 2024-07-16 MED FILL — BIKTARVY 50 MG-200 MG-25 MG TABLET: ORAL | 30 days supply | Qty: 30 | Fill #7

## 2024-07-25 NOTE — Progress Notes (Deleted)
   HPI: Alan Joseph is a 23 y.o. male who presents to the [facility] pharmacy clinic for HIV follow-up.  Patient Active Problem List   Diagnosis Date Noted   HIV disease (HCC) 05/29/2024   Health care maintenance 05/29/2024   Medication management 05/29/2024   Screening for STDs (sexually transmitted diseases) 05/29/2024    Patient's Medications  New Prescriptions   No medications on file  Previous Medications   AMOXICILLIN -CLAVULANATE (AUGMENTIN ) 875-125 MG TABLET    Take 1 tablet by mouth every 12 (twelve) hours.   CYCLOBENZAPRINE  (FLEXERIL ) 10 MG TABLET    Take 1 tablet (10 mg total) by mouth 2 (two) times daily as needed for muscle spasms.   IBUPROFEN  (ADVIL ) 800 MG TABLET    Take 1 tablet (800 mg total) by mouth every 8 (eight) hours as needed for up to 30 doses.   LIDOCAINE  (LIDODERM ) 5 %    Place 1 patch onto the skin daily. Remove & Discard patch within 12 hours or as directed by MD   NAPROXEN  (NAPROSYN ) 500 MG TABLET    Take 1 tablet (500 mg total) by mouth 2 (two) times daily.  Modified Medications   No medications on file  Discontinued Medications   No medications on file    Labs: No results found for: HIV1RNAQUANT, HIV1RNAVL, CD4TABS  RPR and STI No results found for: LABRPR, RPRTITER  STI Results GC CT  06/25/2021  6:16 PM Negative  Negative     Hepatitis B No results found for: HEPBSAB, HEPBSAG, HEPBCAB Hepatitis C No results found for: HEPCAB, HCVRNAPCRQN Hepatitis A No results found for: HAV Lipids: No results found for: CHOL, TRIG, HDL, CHOLHDL, VLDL, LDLCALC  Current HIV Regimen: Biktarvy (was receiving from [facility] delivery) - seems to lack f/u with [facility] ***  Assessment: ***  Plan: *** HIV RNA w/ reflex to genotype, CD4, CMP, CBC w/ differential, lipid panel today STI screening ***   *** didn't put in labs, wait until he comes in ***

## 2024-07-26 ENCOUNTER — Other Ambulatory Visit: Payer: Self-pay

## 2024-07-26 ENCOUNTER — Other Ambulatory Visit (HOSPITAL_COMMUNITY): Payer: Self-pay

## 2024-07-26 ENCOUNTER — Other Ambulatory Visit (HOSPITAL_COMMUNITY)
Admission: RE | Admit: 2024-07-26 | Discharge: 2024-07-26 | Disposition: A | Discharge status: Home / Self Care | Source: Ambulatory Visit | Attending: Internal Medicine | Admitting: Internal Medicine

## 2024-07-26 ENCOUNTER — Ambulatory Visit: Admitting: Internal Medicine

## 2024-07-26 ENCOUNTER — Encounter: Payer: Self-pay | Admitting: Internal Medicine

## 2024-07-26 ENCOUNTER — Ambulatory Visit

## 2024-07-26 ENCOUNTER — Ambulatory Visit: Admitting: Pharmacist

## 2024-07-26 VITALS — BP 104/59 | HR 68 | Temp 97.8°F | Resp 16 | Ht 71.0 in | Wt 161.0 lb

## 2024-07-26 DIAGNOSIS — B20 Human immunodeficiency virus [HIV] disease: Secondary | ICD-10-CM | POA: Insufficient documentation

## 2024-07-26 DIAGNOSIS — F17290 Nicotine dependence, other tobacco product, uncomplicated: Secondary | ICD-10-CM

## 2024-07-26 DIAGNOSIS — Z23 Encounter for immunization: Secondary | ICD-10-CM | POA: Diagnosis not present

## 2024-07-26 MED ORDER — BICTEGRAVIR-EMTRICITAB-TENOFOV 50-200-25 MG PO TABS: 1.0000 | ORAL_TABLET | Freq: Every day | ORAL | 5 refills | Status: DC

## 2024-07-26 NOTE — Progress Notes (Signed)
 [facility]     HPI: Alan Joseph is a 23 y.o. male presents for HIV management. Pt is on [place], he gets it delivered.  Previously seen in ID clinic at [facility] on 06/07/2022 Date of diagnosis Patient diagnosed HIV on 02/22/2020.  Pt was on PREP with truvada and went for follow-up visit when HIV antibody was positive.  This started on Biktarvy.  History of pretty good adherence.  History of folliculitis around his beard.  Risk factors: MSM Partners in last 2months 2, in the last 12 months 4.  Anal sex: verse. sometimes   Social: Occupation: Personal assistant Housing: apt, with roomates Support: family Understanding of HIV: Etoh/drug/tobacco use: social/mj/vape  No past medical history on file.  No past surgical history on file.  No family history on file. Current Outpatient Medications on File Prior to Visit  Medication Sig Dispense Refill   amoxicillin -clavulanate (AUGMENTIN ) 875-125 MG tablet Take 1 tablet by mouth every 12 (twelve) hours. 14 tablet 0   cyclobenzaprine  (FLEXERIL ) 10 MG tablet Take 1 tablet (10 mg total) by mouth 2 (two) times daily as needed for muscle spasms. 20 tablet 0   ibuprofen  (ADVIL ) 800 MG tablet Take 1 tablet (800 mg total) by mouth every 8 (eight) hours as needed for up to 30 doses. 30 tablet 0   lidocaine  (LIDODERM ) 5 % Place 1 patch onto the skin daily. Remove & Discard patch within 12 hours or as directed by MD 30 patch 0   naproxen  (NAPROSYN ) 500 MG tablet Take 1 tablet (500 mg total) by mouth 2 (two) times daily. 30 tablet 0   No current facility-administered medications on file prior to visit.    No Known Allergies    Lab Results No results found for: HIV1RNAQUANT, HIV1RNAVL, CD4TABS No results found for: HIV1GENOSEQ Lab Results  Component Value Date   WBC 8.5 06/28/2021   HGB 12.2 (L) 06/28/2021   HCT 36.9 (L) 06/28/2021   MCV 87.0 06/28/2021   PLT 379 06/28/2021    Lab Results  Component  Value Date   CREATININE 0.82 06/28/2021   BUN 5 (L) 06/28/2021   NA 136 06/28/2021   K 3.9 06/28/2021   CL 104 06/28/2021   CO2 25 06/28/2021   No results found for: ALT, AST, GGT, ALKPHOS, BILITOT  No results found for: CHOL, TRIG, HDL, LDLCALC No results found for: HAV No results found for: HEPBSAG, HEPBSAB No results found for: HCVAB Lab Results  Component Value Date   CHLAMYDIAWP Negative 06/25/2021   N Negative 06/25/2021   No results found for: GCPROBEAPT No results found for: QUANTGOLD  Assessment/Plan #HIV -984 cd4 vl nd  in 2023 pn 06/24/22 -continue biktarvy -he has dental coverage through [facility] -labs today -f/u in 3 months  #vape -smoking cessation  #Vaccination COVID 2022 Flu- today PCV-today Meningitis 1 dose HepA immune HEpB serology Tdap 09/05/23 Shingles  #Health maintenance -Quantiferon -RPR 1:1 on 06/24/22 -HCV -GC -Lipid -Dysplasia screen F/M -Mammogram  -Colonoscopy    Loney Stank, MD [facility] I have personally spent 47 minutes involved in face-to-face and non-face-to-face activities for this patient on the day of the visit. Professional time spent includes the following activities: Preparing to see the patient (review of tests), Obtaining and/or reviewing separately obtained history (admission/discharge record), Performing a medically appropriate examination and/or evaluation , Ordering medications/tests/procedures, referring and communicating with other health care professionals, Documenting clinical information in  the EMR, Independently interpreting results (not separately reported), Communicating results to the patient/family/caregiver, Counseling and educating the patient/family/caregiver and Care coordination (not separately reported).

## 2024-07-27 LAB — CYTOLOGY, (ORAL, ANAL, URETHRAL) ANCILLARY ONLY
Chlamydia: NEGATIVE
Chlamydia: NEGATIVE
Comment: NEGATIVE
Comment: NEGATIVE
Comment: NORMAL
Comment: NORMAL
Neisseria Gonorrhea: NEGATIVE
Neisseria Gonorrhea: POSITIVE — AB

## 2024-07-27 LAB — URINE CYTOLOGY ANCILLARY ONLY
Chlamydia: NEGATIVE
Comment: NEGATIVE
Comment: NORMAL
Neisseria Gonorrhea: NEGATIVE

## 2024-07-27 LAB — T-HELPER CELLS (CD4) COUNT (NOT AT ARMC)
CD4 % Helper T Cell: 42 % (ref 33–65)
CD4 T Cell Abs: 916 /uL (ref 400–1790)

## 2024-07-31 ENCOUNTER — Telehealth: Payer: Self-pay

## 2024-07-31 ENCOUNTER — Telehealth: Payer: Self-pay | Admitting: Internal Medicine

## 2024-07-31 NOTE — Telephone Encounter (Signed)
 Received voicemail from patient requesting to discuss results with provider.   Rectal swab positive for gonorrhea, will need treatment. Routing to provider for orders.   Kyann Heydt, BSN, RN

## 2024-07-31 NOTE — Telephone Encounter (Signed)
 Called pt,, VM full, not able to leave message - needs some in for CTX x 1 for anal gonorrhea treatment. HIV rna pending, rest of labs stable

## 2024-07-31 NOTE — Telephone Encounter (Signed)
 Per Dr. Dennise:  Called pt,, VM full, not able to leave message - needs some in for CTX x 1 for anal gonorrhea treatment. HIV rna pending, rest of labs stable      Called patient, no answer and voicemail full. Will send MyChart message.   Beulah Capobianco, BSN, RN

## 2024-08-01 ENCOUNTER — Other Ambulatory Visit: Payer: Self-pay

## 2024-08-01 ENCOUNTER — Ambulatory Visit (INDEPENDENT_AMBULATORY_CARE_PROVIDER_SITE_OTHER)

## 2024-08-01 DIAGNOSIS — A549 Gonococcal infection, unspecified: Secondary | ICD-10-CM | POA: Diagnosis present

## 2024-08-01 MED ORDER — CEFTRIAXONE SODIUM 500 MG IJ SOLR
500.0000 mg | Freq: Once | INTRAMUSCULAR | Status: AC
  Administered 2024-08-01: 500 mg via INTRAMUSCULAR

## 2024-08-01 NOTE — Telephone Encounter (Signed)
 Patient returned call, relayed rectal swab positive for gonorrhea. He will come in for treatment this morning.   Advised patient no sex until treatment is completed plus an additional 7 days and instructed to notify sexual partners for testing and treatment. Patient verbalized understanding and has no further questions.   Yuepheng Schaller, BSN, RN

## 2024-08-01 NOTE — Progress Notes (Signed)
 Patient in office today for gonorrhea treatment. Patient advised to abstain from sex for 7-10 days and to inform his partner, so they can be tested and treated.  Condoms given to the patient.  Alan Joseph, CMA

## 2024-08-02 LAB — QUANTIFERON-TB GOLD PLUS
Mitogen-NIL: 6.59 [IU]/mL
NIL: 0.07 [IU]/mL
QuantiFERON-TB Gold Plus: NEGATIVE
TB1-NIL: 0 [IU]/mL
TB2-NIL: 0.01 [IU]/mL

## 2024-08-02 LAB — COMPLETE METABOLIC PANEL WITHOUT GFR
AG Ratio: 1.5 (calc) (ref 1.0–2.5)
ALT: 16 U/L (ref 9–46)
AST: 19 U/L (ref 10–40)
Albumin: 4.2 g/dL (ref 3.6–5.1)
Alkaline phosphatase (APISO): 42 U/L (ref 36–130)
BUN: 12 mg/dL (ref 7–25)
CO2: 27 mmol/L (ref 20–32)
Calcium: 9.4 mg/dL (ref 8.6–10.3)
Chloride: 106 mmol/L (ref 98–110)
Creat: 1.01 mg/dL (ref 0.60–1.24)
Globulin: 2.8 g/dL (ref 1.9–3.7)
Glucose, Bld: 87 mg/dL (ref 65–99)
Potassium: 4.3 mmol/L (ref 3.5–5.3)
Sodium: 140 mmol/L (ref 135–146)
Total Bilirubin: 0.3 mg/dL (ref 0.2–1.2)
Total Protein: 7 g/dL (ref 6.1–8.1)

## 2024-08-02 LAB — CBC WITH DIFFERENTIAL/PLATELET
Absolute Lymphocytes: 2406 {cells}/uL (ref 850–3900)
Absolute Monocytes: 484 {cells}/uL (ref 200–950)
Basophils Absolute: 42 {cells}/uL (ref 0–200)
Basophils Relative: 0.9 %
Eosinophils Absolute: 324 {cells}/uL (ref 15–500)
Eosinophils Relative: 6.9 %
HCT: 43 % (ref 38.5–50.0)
Hemoglobin: 14.4 g/dL (ref 13.2–17.1)
MCH: 30.3 pg (ref 27.0–33.0)
MCHC: 33.5 g/dL (ref 32.0–36.0)
MCV: 90.5 fL (ref 80.0–100.0)
MPV: 10.3 fL (ref 7.5–12.5)
Monocytes Relative: 10.3 %
Neutro Abs: 1443 {cells}/uL — ABNORMAL LOW (ref 1500–7800)
Neutrophils Relative %: 30.7 %
Platelets: 359 Thousand/uL (ref 140–400)
RBC: 4.75 Million/uL (ref 4.20–5.80)
RDW: 13.1 % (ref 11.0–15.0)
Total Lymphocyte: 51.2 %
WBC: 4.7 Thousand/uL (ref 3.8–10.8)

## 2024-08-02 LAB — RPR: RPR Ser Ql: NONREACTIVE

## 2024-08-02 LAB — HEPATITIS B SURFACE ANTIBODY,QUALITATIVE: Hep B S Ab: NONREACTIVE

## 2024-08-02 LAB — HIV-1 RNA QUANT-NO REFLEX-BLD
HIV 1 RNA Quant: NOT DETECTED {copies}/mL
HIV-1 RNA Quant, Log: NOT DETECTED {Log_copies}/mL

## 2024-08-02 LAB — HEPATITIS B SURFACE ANTIGEN: Hepatitis B Surface Ag: NONREACTIVE

## 2024-08-02 LAB — HEPATITIS C ANTIBODY: Hepatitis C Ab: NONREACTIVE

## 2024-08-02 LAB — HEPATITIS B CORE ANTIBODY, TOTAL: Hep B Core Total Ab: NONREACTIVE

## 2024-08-15 NOTE — Unmapped (Signed)
 The [facility] has made a second and final attempt to reach this patient to refill the following medication:BIKTARVY  50-200-25 mg tablet (bictegrav-emtricit-tenofov ala).      We have been unable to leave messages on the following phone numbers: [phone], have sent a MyChart message, and have sent a text message to the following phone numbers: [phone].    Dates contacted: 08/08/2024 08/15/2024  Last scheduled delivery: 07/17/2024    The patient may be at risk of non-compliance with this medication. The patient should call the [facility] at [phone]  Option 4, then Option 4: Infectious Disease, Transplant to refill medication.    Alec Hines

## 2024-08-29 NOTE — Progress Notes (Signed)
 The [facility] has made a third and final attempt to reach this patient to refill the following medication: Biktarvy .      We have been unable to leave messages on the following phone numbers: [phone], have sent a MyChart message, and have sent a text message to the following phone numbers: [phone].    Dates contacted: 08/08/24, 08/15/24, 08/29/24  Last scheduled delivery: 07/17/24 (30 day supply)    The patient may be at risk of non-compliance with this medication. The patient should call the [facility] at [phone]  Option 4, then Option 4: Infectious Disease, Transplant to refill medication.    [name], PharmD   [facility] Specialty Pharmacist

## 2024-09-07 NOTE — Progress Notes (Signed)
 [facility] Refill Coordination Note    Specialty Medication(s) to be Shipped:   Infectious Disease: Biktarvy     Other medication(s) to be shipped: No additional medications requested for fill at this time    Specialty Medications not needed at this time: N/A     Alec Hines, DOB: 2001-03-18  Phone: [phone] (home)       All above HIPAA information was verified with patient.     Was a nurse, learning disability used for this call? No    Completed refill call assessment today to schedule patient's medication shipment from the [facility]  [phone]).  All relevant notes have been reviewed.     Specialty medication(s) and dose(s) confirmed: Regimen is correct and unchanged.   Changes to medications: [name] reports no changes at this time.  Changes to insurance: No  New side effects reported not previously addressed with a pharmacist or physician: None reported  Questions for the pharmacist: No    Confirmed patient received a Conservation Officer, Historic Buildings and a Surveyor, Mining with first shipment. The patient will receive a drug information handout for each medication shipped and additional FDA Medication Guides as required.       DISEASE/MEDICATION-SPECIFIC INFORMATION        N/A    SPECIALTY MEDICATION ADHERENCE     Medication Adherence    Patient reported X missed doses in the last month: 0  Specialty Medication: bictegrav-emtricit-tenofov ala: BIKTARVY  50-200-25 mg tablet  Patient is on additional specialty medications: No  Patient is on more than two specialty medications: No  Any gaps in refill history greater than 2 weeks in the last 3 months: no  Demonstrates understanding of importance of adherence: yes  Informant: patient  Confirmed plan for next specialty medication refill: delivery by pharmacy  Refills needed for supportive medications: not needed          Refill Coordination    Has the Patients' Contact Information Changed: No  Is the Shipping Address Different: No         Were doses missed due to medication being on hold? No    bictegrav-emtricit-tenofov ala: BIKTARVY  50-200-25   mg: 3 days of medicine on hand       REFERRAL TO PHARMACIST     Referral to the pharmacist: Not needed      [facility]     Shipping address confirmed in Epic.     Cost and Payment: Patient has a $0 copay, payment information is not required.    Delivery Scheduled: Yes, Expected medication delivery date: 09/11/24.     Medication will be delivered via UPS to the prescription address in Epic WAM.    Alec Hines   [facility]  Specialty Technician

## 2024-09-10 MED FILL — BIKTARVY 50 MG-200 MG-25 MG TABLET: ORAL | 30 days supply | Qty: 30 | Fill #8

## 2024-10-05 NOTE — Progress Notes (Signed)
 The [facility] has made a second and final attempt to reach this patient to refill the following medication:bictegrav-emtricit-tenofov ala: BIKTARVY  50-200-25 mg tablet.      We have been unable to leave messages on the following phone numbers: [phone], have sent a MyChart message, and have sent a text message to the following phone numbers: [phone].    Dates contacted: 09/26/24 and 10/05/24  Last scheduled delivery: 09/10/24    The patient may be at risk of non-compliance with this medication. The patient should call the [facility] at [phone]  Option 4, then Option 4: Infectious Disease, Transplant to refill medication.    [name]   [facility]

## 2024-10-11 ENCOUNTER — Ambulatory Visit: Admitting: Internal Medicine

## 2024-11-06 ENCOUNTER — Ambulatory Visit: Admitting: Internal Medicine

## 2024-11-06 NOTE — Progress Notes (Signed)
 [facility] Refill Coordination Note    Specialty Medication(s) to be Shipped:   Infectious Disease: Biktarvy     Other medication(s) to be shipped: No additional medications requested for fill at this time    Specialty Medications not needed at this time: N/A     Alec Hines, DOB: 2001-09-21  Phone: [phone] (home)       All above HIPAA information was verified with patient.     Was a nurse, learning disability used for this call? No    Completed refill call assessment today to schedule patient's medication shipment from the [facility]  [phone]).  All relevant notes have been reviewed.     Specialty medication(s) and dose(s) confirmed: Regimen is correct and unchanged.   Changes to medications: [name] reports no changes at this time.  Changes to insurance: No  New side effects reported not previously addressed with a pharmacist or physician: None reported  Questions for the pharmacist: No    Confirmed patient received a Conservation Officer, Historic Buildings and a Surveyor, Mining with first shipment. The patient will receive a drug information handout for each medication shipped and additional FDA Medication Guides as required.       DISEASE/MEDICATION-SPECIFIC INFORMATION        N/A    SPECIALTY MEDICATION ADHERENCE     Medication Adherence    Patient reported X missed doses in the last month: 2  Specialty Medication: BIKTARVY  50-200-25 mg tablet (bictegrav-emtricit-tenofov ala)  Patient is on additional specialty medications: No  Informant: patient              Were doses missed due to medication being on hold? No    Biktarvy  50-200-25 mg: 4-5 days of medicine on hand       Specialty medication is an injection or given on a cycle: No    REFERRAL TO PHARMACIST     Referral to the pharmacist: Not needed      [facility]     Shipping address confirmed in Epic.     Cost and Payment: Patient has a $0 copay, payment information is not required.    Delivery Scheduled: Yes, Expected medication delivery date: 11/08/24.     Medication will be delivered via UPS to the prescription address in Epic WAM.    Alec Hines, PharmD   [facility]  Specialty Pharmacist

## 2024-11-07 MED FILL — BIKTARVY 50 MG-200 MG-25 MG TABLET: ORAL | 30 days supply | Qty: 30 | Fill #9

## 2024-11-08 ENCOUNTER — Other Ambulatory Visit: Payer: Self-pay

## 2024-11-08 ENCOUNTER — Encounter: Payer: Self-pay | Admitting: Internal Medicine

## 2024-11-08 ENCOUNTER — Other Ambulatory Visit (HOSPITAL_COMMUNITY)
Admission: RE | Admit: 2024-11-08 | Discharge: 2024-11-08 | Disposition: A | Source: Ambulatory Visit | Attending: Internal Medicine | Admitting: Internal Medicine

## 2024-11-08 ENCOUNTER — Ambulatory Visit: Admitting: Internal Medicine

## 2024-11-08 VITALS — BP 112/69 | HR 72 | Temp 97.8°F | Ht 71.0 in | Wt 158.0 lb

## 2024-11-08 DIAGNOSIS — H00019 Hordeolum externum unspecified eye, unspecified eyelid: Secondary | ICD-10-CM

## 2024-11-08 DIAGNOSIS — B20 Human immunodeficiency virus [HIV] disease: Secondary | ICD-10-CM

## 2024-11-08 DIAGNOSIS — F17290 Nicotine dependence, other tobacco product, uncomplicated: Secondary | ICD-10-CM

## 2024-11-08 DIAGNOSIS — Z79899 Other long term (current) drug therapy: Secondary | ICD-10-CM | POA: Diagnosis not present

## 2024-11-08 MED ORDER — BICTEGRAVIR-EMTRICITAB-TENOFOV 50-200-25 MG PO TABS: 1.0000 | ORAL_TABLET | Freq: Every day | ORAL | 5 refills | Status: AC

## 2024-11-08 NOTE — Progress Notes (Unsigned)
 "      [facility]     HPI: Alan Joseph is a 24 y.o. male presents for HIV management. Pt is on [place], he gets it delivered.  Previously seen in ID clinic at [facility] on 06/07/2022 Date of diagnosis Patient diagnosed HIV on 02/22/2020.  Pt was on PREP with truvada and went for follow-up visit when HIV antibody was positive.  This started on Biktarvy.  History of pretty good adherence.  History of folliculitis around his beard.  Today:  Risk factors: MSM Partners in last 2months 2, in the last 12 months 4.  Anal sex: verse. sometimes     Social: Occupation: personal assistant Housing: apt, with roomates Support: family Understanding of HIV: Etoh/drug/tobacco use: social/mj/vape    No past medical history on file.  No past surgical history on file.  No family history on file. Medications Ordered Prior to Encounter[1]  Allergies[2]    Lab Results HIV 1 RNA Quant (copies/mL)  Date Value  07/26/2024 NOT DETECTED   CD4 T Cell Abs (/uL)  Date Value  07/26/2024 916   No results found for: HIV1GENOSEQ Lab Results  Component Value Date   WBC 4.7 07/26/2024   HGB 14.4 07/26/2024   HCT 43.0 07/26/2024   MCV 90.5 07/26/2024   PLT 359 07/26/2024    Lab Results  Component Value Date   CREATININE 1.01 07/26/2024   BUN 12 07/26/2024   NA 140 07/26/2024   K 4.3 07/26/2024   CL 106 07/26/2024   CO2 27 07/26/2024   Lab Results  Component Value Date   ALT 16 07/26/2024   AST 19 07/26/2024   BILITOT 0.3 07/26/2024    No results found for: CHOL, TRIG, HDL, LDLCALC No results found for: HAV Lab Results  Component Value Date   HEPBSAG NON-REACTIVE 07/26/2024   HEPBSAB NON-REACTIVE 07/26/2024   No results found for: HCVAB Lab Results  Component Value Date   CHLAMYDIAWP Negative 07/26/2024   CHLAMYDIAWP Negative 07/26/2024   CHLAMYDIAWP Negative 07/26/2024   N Negative 07/26/2024   N Negative 07/26/2024   N Positive (A)  07/26/2024   No results found for: GCPROBEAPT No results found for: QUANTGOLD  Assessment/Plan #HIV -915 cd4 vl nd on 07/26/24 -continue biktarvy -labs today -f/u in 6 months   #Stye -folows with PCP  #vape -smoking cessation   #Vaccination COVID 2022 Flu- t925/25 PCV-07/26/24 Meningitis 1 dose HepA immune HEpB serology non-immune Tdap 09/05/23 HPV today 11/09/23 Shingles   #Health maintenance -Quantiferon nr 07/26/24 -RPR 1:1 on 06/24/22 -HCV nr 07/26/24 -GC anal gonorrhea + on 07/26/24(cts on 08/01/24) -Lipid -Dysplasia screen F/M -Mammogram  -Colonoscopy      Loney Stank, MD [facility] I personally spent a total of 42 minutes in the care of the patient today including preparing to see the patient, getting/reviewing separately obtained history, performing a medically appropriate exam/evaluation, counseling and educating, placing orders, documenting clinical information in the EHR, independently interpreting results, and communicating results.     [1]  Current Outpatient Medications on File Prior to Visit  Medication Sig Dispense Refill   bictegravir-emtricitabine-tenofovir AF (BIKTARVY) 50-200-25 MG TABS tablet Take 1 tablet by mouth daily. Try to take at the same time each day with or without food. 30 tablet 5   amoxicillin -clavulanate (AUGMENTIN ) 875-125 MG tablet Take 1 tablet by mouth every 12 (twelve) hours. (Patient not taking: Reported on 11/08/2024) 14 tablet 0   cyclobenzaprine  (FLEXERIL ) 10  MG tablet Take 1 tablet (10 mg total) by mouth 2 (two) times daily as needed for muscle spasms. (Patient not taking: Reported on 11/08/2024) 20 tablet 0   ibuprofen  (ADVIL ) 800 MG tablet Take 1 tablet (800 mg total) by mouth every 8 (eight) hours as needed for up to 30 doses. (Patient not taking: Reported on 11/08/2024) 30 tablet 0   lidocaine  (LIDODERM ) 5 % Place 1 patch onto the skin daily. Remove & Discard patch  within 12 hours or as directed by MD (Patient not taking: Reported on 11/08/2024) 30 patch 0   naproxen  (NAPROSYN ) 500 MG tablet Take 1 tablet (500 mg total) by mouth 2 (two) times daily. (Patient not taking: Reported on 11/08/2024) 30 tablet 0   No current facility-administered medications on file prior to visit.  [2] No Known Allergies  "

## 2024-11-08 NOTE — Progress Notes (Unsigned)
 Patient declined Hpv Vaccince today.

## 2024-11-09 LAB — CYTOLOGY, (ORAL, ANAL, URETHRAL) ANCILLARY ONLY
Chlamydia: NEGATIVE
Chlamydia: NEGATIVE
Comment: NEGATIVE
Comment: NEGATIVE
Comment: NORMAL
Comment: NORMAL
Neisseria Gonorrhea: NEGATIVE
Neisseria Gonorrhea: NEGATIVE

## 2024-11-09 LAB — T-HELPER CELLS (CD4) COUNT (NOT AT ARMC)
CD4 % Helper T Cell: 45 % (ref 33–65)
CD4 T Cell Abs: 918 /uL (ref 400–1790)

## 2024-11-09 LAB — URINE CYTOLOGY ANCILLARY ONLY
Chlamydia: NEGATIVE
Comment: NEGATIVE
Comment: NORMAL
Neisseria Gonorrhea: NEGATIVE

## 2024-11-10 LAB — COMPLETE METABOLIC PANEL WITHOUT GFR
AG Ratio: 1.6 (calc) (ref 1.0–2.5)
ALT: 14 U/L (ref 9–46)
AST: 21 U/L (ref 10–40)
Albumin: 4.5 g/dL (ref 3.6–5.1)
Alkaline phosphatase (APISO): 41 U/L (ref 36–130)
BUN: 13 mg/dL (ref 7–25)
CO2: 28 mmol/L (ref 20–32)
Calcium: 9.4 mg/dL (ref 8.6–10.3)
Chloride: 105 mmol/L (ref 98–110)
Creat: 0.93 mg/dL (ref 0.60–1.24)
Globulin: 2.8 g/dL (ref 1.9–3.7)
Glucose, Bld: 92 mg/dL (ref 65–99)
Potassium: 4.2 mmol/L (ref 3.5–5.3)
Sodium: 140 mmol/L (ref 135–146)
Total Bilirubin: 0.6 mg/dL (ref 0.2–1.2)
Total Protein: 7.3 g/dL (ref 6.1–8.1)

## 2024-11-10 LAB — CBC WITH DIFFERENTIAL/PLATELET
Absolute Lymphocytes: 2223 {cells}/uL (ref 850–3900)
Absolute Monocytes: 338 {cells}/uL (ref 200–950)
Basophils Absolute: 19 {cells}/uL (ref 0–200)
Basophils Relative: 0.5 %
Eosinophils Absolute: 99 {cells}/uL (ref 15–500)
Eosinophils Relative: 2.6 %
HCT: 45.7 % (ref 39.4–51.1)
Hemoglobin: 15.1 g/dL (ref 13.2–17.1)
MCH: 29.4 pg (ref 27.0–33.0)
MCHC: 33 g/dL (ref 31.6–35.4)
MCV: 89.1 fL (ref 81.4–101.7)
MPV: 10 fL (ref 7.5–12.5)
Monocytes Relative: 8.9 %
Neutro Abs: 1121 {cells}/uL — ABNORMAL LOW (ref 1500–7800)
Neutrophils Relative %: 29.5 %
Platelets: 324 Thousand/uL (ref 140–400)
RBC: 5.13 Million/uL (ref 4.20–5.80)
RDW: 13.2 % (ref 11.0–15.0)
Total Lymphocyte: 58.5 %
WBC: 3.8 Thousand/uL (ref 3.8–10.8)

## 2024-11-10 LAB — HIV-1 RNA QUANT-NO REFLEX-BLD
HIV 1 RNA Quant: NOT DETECTED {copies}/mL
HIV-1 RNA Quant, Log: NOT DETECTED {Log_copies}/mL

## 2024-11-10 LAB — SYPHILIS: RPR W/REFLEX TO RPR TITER AND TREPONEMAL ANTIBODIES, TRADITIONAL SCREENING AND DIAGNOSIS ALGORITHM: RPR Ser Ql: NONREACTIVE

## 2024-11-20 ENCOUNTER — Ambulatory Visit: Payer: Self-pay | Admitting: *Deleted

## 2024-11-20 NOTE — Telephone Encounter (Signed)
 FYI Only or Action Required?: FYI only for provider: UC/Mobile unit advised.  Patient was last seen in primary care on no PCP.  Called Nurse Triage reporting Stye.  Symptoms began several months ago.  Interventions attempted: Ice/heat application.  Symptoms are: gradually worsening.  Triage Disposition: See Physician Within 24 Hours  Patient/caregiver understands and will follow disposition?: Yes  Message from Harlene ORN sent at 11/20/2024  9:27 AM EST  Reason for Triage: He has two styes in his right eye and one in his left. They're not painful, but does have blurry vision, drainage and itchiness.   Reason for Disposition  2 or more styes are present  Answer Assessment - Initial Assessment Questions Patient is calling for NP appointment- transferred to nurse due to red word. Patient was triaged and advised- UC/mobile unit for immediate need. Patient was in process of being scheduled for NP appointment- call dropped. Unable to contact patient back - no answer and unable to leave message. Patient may be scheduled for NP appointment if calls back.   1. LOCATION: Which eye has the sty? Upper or lower eyelid?     3- 2-R, 1-L 2. SIZE: How big is it? (Note: standard pencil eraser is 6 mm)     R- pea sized, smaller on L 3. EYELID: Is the eyelid swollen? If Yes, ask: How much?     L- inner eyelid- small, R eyelid- larger 4. REDNESS: Has the redness spread onto the eyelid?     no 5. ONSET: When did you notice the sty?     R- 07/2024, L-09/2024 6. VISION: Do you have blurred vision?      No- field of vision 7. PAIN: Is it painful? If Yes, ask: How bad is the pain?  (Scale 1-10; or mild, moderate, severe)     Not painful 8. CONTACTS: Do you wear contacts?     no 9. OTHER SYMPTOMS: Do you have any other symptoms? (e.g., fever)     Patient does use warm compress, drainage morning and night Patient states he was seen at [facility] and given ointment and oral medication that  did not help.  Protocols used: Sty-A-AH

## 2024-11-20 NOTE — Telephone Encounter (Signed)
 Patient called back from disconnected triage call to scheduled new patient appointment.  New patient appointment scheduled.  Patient inquired on mobile medicine-information and location for today provided. No further questions or needs at this time.

## 2024-11-21 ENCOUNTER — Ambulatory Visit: Payer: Self-pay | Admitting: Internal Medicine

## 2024-12-04 DIAGNOSIS — Z21 Asymptomatic human immunodeficiency virus [HIV] infection status: Secondary | ICD-10-CM

## 2024-12-04 DIAGNOSIS — B20 Human immunodeficiency virus [HIV] disease: Secondary | ICD-10-CM

## 2024-12-04 DIAGNOSIS — Z717 Human immunodeficiency virus [HIV] counseling: Principal | ICD-10-CM

## 2024-12-04 MED ORDER — BIKTARVY 50 MG-200 MG-25 MG TABLET
ORAL_TABLET | Freq: Every day | ORAL | 11 refills | 30.00000 days
Start: 2024-12-04 — End: 2025-12-04

## 2024-12-04 NOTE — Progress Notes (Addendum)
 This pharmacist was notified by a technician that this patient has reported that they've missed 1 doses of their Biktarvy .. I have reviewed the patient's medical record and have determined that no further pharmacist action is needed.      Approximate time spent: 0-5 minutes    [name], PharmD, Clinical Specialty Pharmacist  [facility]      [facility] Refill Coordination Note    Specialty Medication(s) to be Shipped:   Infectious Disease: BIKTARVY  50-200-25 mg tablet (bictegrav-emtricit-tenofov ala)    Other medication(s) to be shipped: No additional medications requested for fill at this time    Specialty Medications not needed at this time: N/A     Alec Hines, DOB: [birth year]  Phone: [phone] (home)       All above HIPAA information was verified with patient.     Was a nurse, learning disability used for this call? No    Completed refill call assessment today to schedule patient's medication shipment from the [facility]  [phone]).  All relevant notes have been reviewed.     Specialty medication(s) and dose(s) confirmed: Regimen is correct and unchanged.   Changes to medications: Alec Hines reports no changes at this time.  Changes to insurance: No  New side effects reported not previously addressed with a pharmacist or physician: None reported  Questions for the pharmacist: No    Confirmed patient received a Conservation Officer, Historic Buildings and a Surveyor, Mining with first shipment. The patient will receive a drug information handout for each medication shipped and additional FDA Medication Guides as required.       DISEASE/MEDICATION-SPECIFIC INFORMATION        N/A    SPECIALTY MEDICATION ADHERENCE     Medication Adherence    Patient reported X missed doses in the last month: 1  Specialty Medication: BIKTARVY  50-200-25 mg tablet (bictegrav-emtricit-tenofov ala)  Patient is on additional specialty medications: No  Informant: patient              Were doses missed due to medication being on hold? No    BIKTARVY  50-200-25 mg tablet (bictegrav-emtricit-tenofov ala): 7-8 days of medicine on hand       Specialty medication is an injection or given on a cycle: No    REFERRAL TO PHARMACIST     Referral to the pharmacist: Yes - routine compliance concerns. Patient has missed 1-3 doses of medication. Refills were scheduled and concern routed to pharmacist for evaluation.      SHIPPING     Shipping address confirmed in Epic.     Cost and Payment: Patient has a $0 copay, payment information is not required.    Delivery Scheduled: Yes, Expected medication delivery date: 2/6.  However, Rx request for refills was sent to the provider as there are none remaining.     Medication will be delivered via UPS to the prescription address in Epic WAM.    Alec Hines [facility]

## 2024-12-04 NOTE — Telephone Encounter (Signed)
 Called PT to schedule a follow up visit. Pts mother will have him call tomorrow after work.SABRA

## 2024-12-06 MED ORDER — BIKTARVY 50 MG-200 MG-25 MG TABLET
ORAL_TABLET | Freq: Every day | ORAL | 0 refills | 30.00000 days | Status: CP
Start: 2024-12-06 — End: 2025-01-05
  Filled 2024-12-06: qty 30, 30d supply, fill #0

## 2024-12-26 ENCOUNTER — Ambulatory Visit: Payer: Self-pay | Admitting: Family Medicine
